# Patient Record
Sex: Female | Born: 1945 | Race: White | Hispanic: No | Smoking: Current some day smoker
Health system: Southern US, Community
[De-identification: ages and names within clinical notes are randomized; demographics above are authoritative.]

## PROBLEM LIST (undated history)

## (undated) DIAGNOSIS — E785 Hyperlipidemia, unspecified: Secondary | ICD-10-CM

## (undated) DIAGNOSIS — R918 Other nonspecific abnormal finding of lung field: Secondary | ICD-10-CM

## (undated) DIAGNOSIS — K589 Irritable bowel syndrome without diarrhea: Secondary | ICD-10-CM

## (undated) DIAGNOSIS — M810 Age-related osteoporosis without current pathological fracture: Secondary | ICD-10-CM

## (undated) DIAGNOSIS — Z8781 Personal history of (healed) traumatic fracture: Secondary | ICD-10-CM

## (undated) DIAGNOSIS — M0579 Rheumatoid arthritis with rheumatoid factor of multiple sites without organ or systems involvement: Secondary | ICD-10-CM

## (undated) DIAGNOSIS — I10 Essential (primary) hypertension: Secondary | ICD-10-CM

## (undated) DIAGNOSIS — J449 Chronic obstructive pulmonary disease, unspecified: Secondary | ICD-10-CM

## (undated) DIAGNOSIS — D751 Secondary polycythemia: Secondary | ICD-10-CM

## (undated) DIAGNOSIS — E119 Type 2 diabetes mellitus without complications: Secondary | ICD-10-CM

## (undated) DIAGNOSIS — I700 Atherosclerosis of aorta: Secondary | ICD-10-CM

## (undated) DIAGNOSIS — I499 Cardiac arrhythmia, unspecified: Secondary | ICD-10-CM

## (undated) DIAGNOSIS — C3490 Malignant neoplasm of unspecified part of unspecified bronchus or lung: Secondary | ICD-10-CM

## (undated) HISTORY — DX: Irritable bowel syndrome, unspecified: K58.9

## (undated) HISTORY — PX: OTHER SURGICAL HISTORY: SHX169

## (undated) HISTORY — DX: Secondary polycythemia: D75.1

## (undated) HISTORY — DX: Personal history of (healed) traumatic fracture: Z87.81

## (undated) HISTORY — DX: Cardiac arrhythmia, unspecified: I49.9

## (undated) HISTORY — PX: COLONOSCOPY: SHX174

## (undated) HISTORY — DX: Age-related osteoporosis without current pathological fracture: M81.0

## (undated) HISTORY — DX: Type 2 diabetes mellitus without complications: E11.9

## (undated) HISTORY — DX: Atherosclerosis of aorta: I70.0

## (undated) HISTORY — DX: Hyperlipidemia, unspecified: E78.5

## (undated) HISTORY — DX: Malignant neoplasm of unspecified part of unspecified bronchus or lung: C34.90

## (undated) HISTORY — DX: Chronic obstructive pulmonary disease, unspecified: J44.9

## (undated) HISTORY — DX: Essential (primary) hypertension: I10

## (undated) HISTORY — DX: Other nonspecific abnormal finding of lung field: R91.8

## (undated) HISTORY — DX: Rheumatoid arthritis with rheumatoid factor of multiple sites without organ or systems involvement: M05.79

---

## 1968-08-16 HISTORY — PX: BREAST BIOPSY: SHX20

## 2004-12-25 ENCOUNTER — Ambulatory Visit: Payer: Self-pay

## 2005-01-12 ENCOUNTER — Ambulatory Visit: Payer: Self-pay | Admitting: Internal Medicine

## 2005-04-09 ENCOUNTER — Ambulatory Visit: Payer: Self-pay | Admitting: Internal Medicine

## 2005-05-27 ENCOUNTER — Ambulatory Visit: Payer: Self-pay

## 2005-05-28 ENCOUNTER — Ambulatory Visit: Payer: Self-pay

## 2005-12-12 ENCOUNTER — Other Ambulatory Visit: Payer: Self-pay

## 2005-12-12 ENCOUNTER — Emergency Department: Payer: Self-pay | Admitting: Emergency Medicine

## 2006-01-14 ENCOUNTER — Ambulatory Visit: Payer: Self-pay | Admitting: Internal Medicine

## 2007-01-17 ENCOUNTER — Ambulatory Visit: Payer: Self-pay | Admitting: Internal Medicine

## 2007-03-10 ENCOUNTER — Other Ambulatory Visit: Payer: Self-pay

## 2007-03-10 ENCOUNTER — Ambulatory Visit: Payer: Self-pay | Admitting: Unknown Physician Specialty

## 2007-03-17 ENCOUNTER — Ambulatory Visit: Payer: Self-pay | Admitting: Gastroenterology

## 2007-03-21 ENCOUNTER — Ambulatory Visit: Payer: Self-pay | Admitting: Unknown Physician Specialty

## 2008-02-06 ENCOUNTER — Ambulatory Visit: Payer: Self-pay | Admitting: Internal Medicine

## 2009-03-27 ENCOUNTER — Ambulatory Visit: Payer: Self-pay | Admitting: Internal Medicine

## 2010-01-02 ENCOUNTER — Emergency Department: Payer: Self-pay | Admitting: Emergency Medicine

## 2010-04-28 ENCOUNTER — Ambulatory Visit: Payer: Self-pay | Admitting: Internal Medicine

## 2010-05-08 ENCOUNTER — Ambulatory Visit: Payer: Self-pay | Admitting: Gastroenterology

## 2010-05-13 LAB — PATHOLOGY REPORT

## 2011-05-12 ENCOUNTER — Ambulatory Visit: Payer: Self-pay | Admitting: Internal Medicine

## 2012-05-12 ENCOUNTER — Ambulatory Visit: Payer: Self-pay | Admitting: Internal Medicine

## 2012-10-06 ENCOUNTER — Ambulatory Visit: Payer: Self-pay | Admitting: Internal Medicine

## 2012-10-19 ENCOUNTER — Ambulatory Visit: Payer: Self-pay | Admitting: Hematology and Oncology

## 2012-10-19 LAB — CBC CANCER CENTER
Basophil #: 0.1 x10 3/mm (ref 0.0–0.1)
Basophil %: 1.4 %
Eosinophil #: 0.1 x10 3/mm (ref 0.0–0.7)
HCT: 50.7 % — ABNORMAL HIGH (ref 35.0–47.0)
HGB: 17.6 g/dL — ABNORMAL HIGH (ref 12.0–16.0)
MCH: 39.4 pg — ABNORMAL HIGH (ref 26.0–34.0)
MCHC: 34.7 g/dL (ref 32.0–36.0)
Monocyte #: 0.6 x10 3/mm (ref 0.2–0.9)
Neutrophil #: 4.4 x10 3/mm (ref 1.4–6.5)
Neutrophil %: 70.7 %
RBC: 4.47 10*6/uL (ref 3.80–5.20)
RDW: 14.9 % — ABNORMAL HIGH (ref 11.5–14.5)
WBC: 6.3 x10 3/mm (ref 3.6–11.0)

## 2012-10-27 LAB — CREATININE, SERUM
Creatinine: 0.81 mg/dL (ref 0.60–1.30)
EGFR (African American): >60
EGFR (Non-African Amer.): >60

## 2012-11-02 LAB — CBC CANCER CENTER
Eosinophil %: 0.6 %
HGB: 15.5 g/dL (ref 12.0–16.0)
Lymphocyte #: 1.1 x10 3/mm (ref 1.0–3.6)
MCH: 39.7 pg — ABNORMAL HIGH (ref 26.0–34.0)
MCHC: 34.6 g/dL (ref 32.0–36.0)
MCV: 115 fL — ABNORMAL HIGH (ref 80–100)
Monocyte #: 0.6 x10 3/mm (ref 0.2–0.9)
Neutrophil #: 4.4 x10 3/mm (ref 1.4–6.5)
Neutrophil %: 70.9 %
RDW: 15.2 % — ABNORMAL HIGH (ref 11.5–14.5)
WBC: 6.2 x10 3/mm (ref 3.6–11.0)

## 2012-11-14 ENCOUNTER — Ambulatory Visit: Payer: Self-pay | Admitting: Hematology and Oncology

## 2012-12-08 LAB — CBC CANCER CENTER
Eosinophil %: 1.6 %
HCT: 49 % — ABNORMAL HIGH (ref 35.0–47.0)
Lymphocyte #: 1.5 x10 3/mm (ref 1.0–3.6)
MCH: 40.1 pg — ABNORMAL HIGH (ref 26.0–34.0)
MCHC: 35 g/dL (ref 32.0–36.0)
Monocyte #: 0.8 x10 3/mm (ref 0.2–0.9)
Monocyte %: 9 %
Neutrophil #: 6 x10 3/mm (ref 1.4–6.5)
Neutrophil %: 69.9 %
Platelet: 166 x10 3/mm (ref 150–440)
RDW: 14.7 % — ABNORMAL HIGH (ref 11.5–14.5)
WBC: 8.5 x10 3/mm (ref 3.6–11.0)

## 2012-12-14 ENCOUNTER — Ambulatory Visit: Payer: Self-pay | Admitting: Hematology and Oncology

## 2013-01-22 ENCOUNTER — Ambulatory Visit: Payer: Self-pay | Admitting: Internal Medicine

## 2013-04-24 ENCOUNTER — Ambulatory Visit: Payer: Self-pay | Admitting: Internal Medicine

## 2013-05-14 ENCOUNTER — Ambulatory Visit: Payer: Self-pay | Admitting: Internal Medicine

## 2013-05-28 ENCOUNTER — Ambulatory Visit: Payer: Self-pay | Admitting: Internal Medicine

## 2013-10-24 ENCOUNTER — Ambulatory Visit: Payer: Self-pay | Admitting: Internal Medicine

## 2013-11-09 ENCOUNTER — Ambulatory Visit: Payer: Self-pay | Admitting: Cardiothoracic Surgery

## 2013-11-14 ENCOUNTER — Ambulatory Visit: Payer: Self-pay | Admitting: Cardiothoracic Surgery

## 2013-11-26 LAB — CBC CANCER CENTER
BASOS ABS: 0.1 x10 3/mm (ref 0.0–0.1)
BASOS PCT: 1.1 %
EOS PCT: 2.2 %
Eosinophil #: 0.2 x10 3/mm (ref 0.0–0.7)
HCT: 48.8 % — ABNORMAL HIGH (ref 35.0–47.0)
HGB: 16.6 g/dL — AB (ref 12.0–16.0)
Lymphocyte #: 1.7 x10 3/mm (ref 1.0–3.6)
Lymphocyte %: 20.5 %
MCH: 37.1 pg — AB (ref 26.0–34.0)
MCHC: 34 g/dL (ref 32.0–36.0)
MCV: 109 fL — ABNORMAL HIGH (ref 80–100)
MONO ABS: 0.5 x10 3/mm (ref 0.2–0.9)
Monocyte %: 6.5 %
NEUTROS PCT: 69.7 %
Neutrophil #: 5.8 x10 3/mm (ref 1.4–6.5)
Platelet: 156 x10 3/mm (ref 150–440)
RBC: 4.47 10*6/uL (ref 3.80–5.20)
RDW: 14 % (ref 11.5–14.5)
WBC: 8.3 x10 3/mm (ref 3.6–11.0)

## 2013-12-03 LAB — CANCER CENTER HEMATOCRIT: HCT: 44.9 % (ref 35.0–47.0)

## 2013-12-10 LAB — CANCER CENTER HEMATOCRIT: HCT: 43.3 % (ref 35.0–47.0)

## 2013-12-14 ENCOUNTER — Ambulatory Visit: Payer: Self-pay | Admitting: Cardiothoracic Surgery

## 2013-12-17 LAB — CANCER CENTER HEMATOCRIT: HCT: 39.7 % (ref 35.0–47.0)

## 2013-12-24 LAB — CANCER CENTER HEMATOCRIT: HCT: 42.7 % (ref 35.0–47.0)

## 2014-01-08 LAB — CANCER CENTER HEMATOCRIT: HCT: 45.2 % (ref 35.0–47.0)

## 2014-01-14 ENCOUNTER — Ambulatory Visit: Payer: Self-pay | Admitting: Cardiothoracic Surgery

## 2014-01-14 ENCOUNTER — Ambulatory Visit: Payer: Self-pay | Admitting: Internal Medicine

## 2014-01-21 LAB — CBC CANCER CENTER
BASOS PCT: 1.2 %
Basophil #: 0.1 x10 3/mm (ref 0.0–0.1)
Eosinophil #: 0.2 x10 3/mm (ref 0.0–0.7)
Eosinophil %: 2.6 %
HCT: 45.8 % (ref 35.0–47.0)
HGB: 15.7 g/dL (ref 12.0–16.0)
LYMPHS PCT: 17.1 %
Lymphocyte #: 1.3 x10 3/mm (ref 1.0–3.6)
MCH: 38 pg — ABNORMAL HIGH (ref 26.0–34.0)
MCHC: 34.3 g/dL (ref 32.0–36.0)
MCV: 111 fL — AB (ref 80–100)
Monocyte #: 0.5 x10 3/mm (ref 0.2–0.9)
Monocyte %: 7 %
Neutrophil #: 5.4 x10 3/mm (ref 1.4–6.5)
Neutrophil %: 72.1 %
Platelet: 167 x10 3/mm (ref 150–440)
RBC: 4.14 10*6/uL (ref 3.80–5.20)
RDW: 13.9 % (ref 11.5–14.5)
WBC: 7.4 x10 3/mm (ref 3.6–11.0)

## 2014-01-21 LAB — HEPATIC FUNCTION PANEL A (ARMC)
AST: 17 U/L (ref 15–37)
Albumin: 3.8 g/dL (ref 3.4–5.0)
Alkaline Phosphatase: 82 U/L
Bilirubin, Direct: 0.1 mg/dL (ref 0.00–0.20)
Bilirubin,Total: 0.4 mg/dL (ref 0.2–1.0)
SGPT (ALT): 18 U/L (ref 12–78)
TOTAL PROTEIN: 8.4 g/dL — AB (ref 6.4–8.2)

## 2014-01-21 LAB — CREATININE, SERUM
CREATININE: 0.77 mg/dL (ref 0.60–1.30)
EGFR (African American): >60
EGFR (Non-African Amer.): >60

## 2014-02-11 LAB — FOLATE: Folic Acid: 6.3 ng/mL (ref 3.1–100.0)

## 2014-02-11 LAB — CANCER CENTER HEMATOCRIT: HCT: 45.2 % (ref 35.0–47.0)

## 2014-02-13 ENCOUNTER — Ambulatory Visit: Payer: Self-pay | Admitting: Cardiothoracic Surgery

## 2014-02-13 ENCOUNTER — Ambulatory Visit: Payer: Self-pay | Admitting: Internal Medicine

## 2014-03-04 LAB — CANCER CENTER HEMATOCRIT: HCT: 48 % — ABNORMAL HIGH (ref 35.0–47.0)

## 2014-03-11 LAB — CANCER CENTER HEMATOCRIT: HCT: 44.5 % (ref 35.0–47.0)

## 2014-03-16 ENCOUNTER — Ambulatory Visit: Payer: Self-pay | Admitting: Cardiothoracic Surgery

## 2014-03-18 LAB — CANCER CENTER HEMATOCRIT: HCT: 41.1 % (ref 35.0–47.0)

## 2014-03-25 LAB — CANCER CENTER HEMATOCRIT: HCT: 39.5 % (ref 35.0–47.0)

## 2014-04-09 DIAGNOSIS — M0579 Rheumatoid arthritis with rheumatoid factor of multiple sites without organ or systems involvement: Secondary | ICD-10-CM | POA: Insufficient documentation

## 2014-04-09 DIAGNOSIS — M810 Age-related osteoporosis without current pathological fracture: Secondary | ICD-10-CM | POA: Insufficient documentation

## 2014-04-15 LAB — CANCER CENTER HEMATOCRIT: HCT: 42.3 % (ref 35.0–47.0)

## 2014-04-16 ENCOUNTER — Ambulatory Visit: Payer: Self-pay | Admitting: Cardiothoracic Surgery

## 2014-05-06 LAB — CANCER CENTER HEMATOCRIT: HCT: 44.9 % (ref 35.0–47.0)

## 2014-05-21 ENCOUNTER — Ambulatory Visit: Payer: Self-pay | Admitting: Internal Medicine

## 2014-05-27 ENCOUNTER — Ambulatory Visit: Payer: Self-pay | Admitting: Cardiothoracic Surgery

## 2014-05-27 LAB — CANCER CENTER HEMATOCRIT: HCT: 42.8 % (ref 35.0–47.0)

## 2014-06-16 ENCOUNTER — Ambulatory Visit: Payer: Self-pay | Admitting: Cardiothoracic Surgery

## 2014-06-19 LAB — CANCER CENTER HEMATOCRIT: HCT: 45.3 % (ref 35.0–47.0)

## 2014-07-08 LAB — CBC CANCER CENTER
BASOS PCT: 1.4 %
Basophil #: 0.1 x10 3/mm (ref 0.0–0.1)
EOS ABS: 0.2 x10 3/mm (ref 0.0–0.7)
Eosinophil %: 2.6 %
HCT: 44.8 % (ref 35.0–47.0)
HGB: 14.7 g/dL (ref 12.0–16.0)
Lymphocyte #: 1.7 x10 3/mm (ref 1.0–3.6)
Lymphocyte %: 23.8 %
MCH: 34.6 pg — ABNORMAL HIGH (ref 26.0–34.0)
MCHC: 32.9 g/dL (ref 32.0–36.0)
MCV: 105 fL — AB (ref 80–100)
MONO ABS: 0.6 x10 3/mm (ref 0.2–0.9)
MONOS PCT: 8.4 %
Neutrophil #: 4.4 x10 3/mm (ref 1.4–6.5)
Neutrophil %: 63.8 %
Platelet: 182 x10 3/mm (ref 150–440)
RBC: 4.26 10*6/uL (ref 3.80–5.20)
RDW: 15 % — ABNORMAL HIGH (ref 11.5–14.5)
WBC: 7 x10 3/mm (ref 3.6–11.0)

## 2014-07-16 ENCOUNTER — Ambulatory Visit: Payer: Self-pay | Admitting: Cardiothoracic Surgery

## 2014-08-05 LAB — CANCER CENTER HEMATOCRIT: HCT: 43.4 % (ref 35.0–47.0)

## 2014-08-16 ENCOUNTER — Ambulatory Visit: Payer: Self-pay | Admitting: Cardiothoracic Surgery

## 2014-09-02 LAB — CANCER CENTER HEMATOCRIT: HCT: 41.9 % (ref 35.0–47.0)

## 2014-09-16 ENCOUNTER — Ambulatory Visit: Payer: Self-pay | Admitting: Cardiothoracic Surgery

## 2014-10-15 ENCOUNTER — Ambulatory Visit
Admit: 2014-10-15 | Disposition: A | Discharge status: Home / Self Care | Payer: Self-pay | Attending: Internal Medicine | Admitting: Internal Medicine

## 2014-11-15 ENCOUNTER — Ambulatory Visit
Admit: 2014-11-15 | Disposition: A | Discharge status: Home / Self Care | Payer: Self-pay | Attending: Cardiothoracic Surgery | Admitting: Cardiothoracic Surgery

## 2014-11-25 LAB — CANCER CENTER HEMATOCRIT: HCT: 42.4 % (ref 35.0–47.0)

## 2014-12-07 NOTE — Consult Note (Signed)
Reason for Visit: This 69 year old Female patient presents to the clinic for initial evaluation of  possible lung cancer .   Referred by Dr. Faith Rogue.  Diagnosis:  Chief Complaint/Diagnosis   69 year-old female with left upper lobe nodular density being followed forsuspicion of carcinoma.  Imaging Report serial CT scans reviewed   Referral Report clinical notes reviewed   Planned Treatment Regimen observation at this time   HPI   patient is a 69 year old female with a history of polycythemia vera who initially presented with a CT scan in 2014 showing a 1 cm lesion in the left upper lobe. This has been followed with serial CT scan showing slight progression of disease. PET CT scan was ordered although Medicare would not pay for it and it was not performed. She's been followed by Dr. Faith Rogue for concern this may be a early pulmonary carcinoma. Her FEV1 is 39% of predicted and her DLCO is low at 37% predicted. Surgical exploration isn't not indicated and has been turned down by 6 surgical oncology. She is on multiple inhalers continues to have significant shortness of breath. No hemoptysis. She is now referred to reach oncology for consideration of opinion regarding treatment.patient has a significant smoking history and continues to smoke.  Past Hx:    Bursitis:    Anxiety:    Hyperlipidemia:    COPD:    IBS:    Polycythemia:    Skin Cancer:    Vitamin D Deficiency:    Osteoporosis:    vasomotor rhinitis:    Diabetes Mellitus, Type II (NIDD):    Rheumatoid Arthritis:    Cholesterol:    HTN:    breast biopsy:   Past, Family and Social History:  Past Medical History positive   Cardiovascular hyperlipidemia; hypertension   Respiratory COPD   Gastrointestinal irritable bowel syndrome   Endocrine diabetes mellitus   Neurological/Psychiatric anxiety   Past Medical History Comments polycythemia vera, rheumatoid arthritis, vasomotor rhinitis, osteoporosis, vitamin D  deficiency, bursitis   Family History positive   Family History Comments 3 brothers with lung cancer, father with COPD and mother with adult-onset diabetes   Social History positive   Social History Comments greater than 50-pack-year smoking history continues to smoke. social EtOH use history   Additional Past Medical and Surgical History accompanied by her son today   Allergies:   Actonel: Chest Tightness  Celexa: Chest Tightness  Citalopram: Chest Tightness  Augmentin: Resp. Distress  PCN: SOB  Cipro: Itching  Maxzide: Unknown  NSAIDS: Unknown  Nicoderm: Unknown  Zyban: Unknown  Wellbutrin: Unknown  Oxycodone: Alt Ment Status  Dicyclomine: Other  Ace Inhibitors: Unknown  Home Meds:  Home Medications: Medication Instructions Status  nicotine 7 mg/24 hr transdermal film, extended release 1 patch transdermal once a day x 2 weeks, starting on 01/21/14. Active  nicotine 14 mg/24 hr transdermal film, extended release 1 patch transdermal once a day x 2 weeks, starting on 01/07/14.   Active  nicotine 21 mg/24 hr transdermal film, extended release 1 patch transdermal once a day x 6 weeks Active  Advair Diskus 250 mcg-50 mcg inhalation powder 1 puff(s) inhaled 2 times a day Active  citalopram 20 mg oral tablet 1 tab(s) orally once a day Active  Flonase 50 mcg/inh nasal spray 2 spray(s) nasal once a day Active  alendronate 70 mg oral tablet 1 tab(s) orally once a week Active  albuterol CFC free 90 mcg/inh inhalation aerosol 2 puff(s) inhaled 4 times a day Active  cholecalciferol 1000 intl units oral capsule 1 cap(s) orally once a day Active  Klonopin 0.5 mg oral tablet 1 tab(s) orally 2 times a day, As Needed- for Anxiety, Nervousness  Active  omeprazole 20 mg oral delayed release capsule 1 cap(s) orally once a day Active  aspirin 81 mg oral tablet 1 tab(s) orally once a day:  PER PT SHE HAS STOPPED TAKING ASPIRIN PER THE MD'S NURSE AS OF 11/22/13 Active  atenolol 100 mg oral tablet 1  tab(s) orally once a day Active  amLODIPine 2.5 mg oral tablet 1 tab(s) orally once a day Active  mirtazapine 15 mg oral tablet 1 tab(s) orally once a day (at bedtime) Active  Vitamin B12 500 mcg oral tablet 1 tab(s) orally once a day Active   Review of Systems:  General negative   Performance Status (ECOG) 0   Skin negative   Breast negative   Ophthalmologic negative   ENMT negative   Respiratory and Thorax see HPI   Cardiovascular negative   Gastrointestinal negative   Genitourinary negative   Musculoskeletal negative   Neurological negative   Psychiatric negative   Hematology/Lymphatics negative   Endocrine negative   Allergic/Immunologic negative   Review of Systems   review of systems obtained from nurses notes  Nursing Notes:  Nursing Vital Signs and Chemo Nursing Nursing Notes: *CC Vital Signs Flowsheet:   13-Apr-15 09:09  Temp Temperature 95.9  Respirations Respirations 22  SBP SBP 158  DBP DBP 84  Pain Scale (0-10)  0  Current Weight (kg) (kg) 62.7  Height (cm) centimeters 163  BSA (m2) 1.6    11:44  Vital Signs Type Vital Signs Type Post-Procedure  Pulse Pulse 79  Respirations Respirations 20  SBP SBP 145  DBP DBP 92   Physical Exam:  General/Skin/HEENT:  General normal   Skin normal   Eyes normal   ENMT normal   Head and Neck normal   Additional PE well-developed female in NAD. Lungs are clear to A&P cardiac examination shows regular rate and rhythm. Abdomen is benign. No cervical or supraclavicular adenopathy is detected.   Breasts/Resp/CV/GI/GU:  Respiratory and Thorax normal   Cardiovascular normal   Gastrointestinal normal   Genitourinary normal   MS/Neuro/Psych/Lymph:  Musculoskeletal normal   Neurological normal   Lymphatics normal   Other Results:  Radiology Results: LabUnknown:    09-Sep-14 15:19, CT Chest Without Contrast  PACS Image     11-Mar-15 15:17, CT Chest Without Contrast  PACS Image   CT:     09-Sep-14 15:19, CT Chest Without Contrast  CT Chest Without Contrast   REASON FOR EXAM:    3 Follow Up Pulmonary Nodules  COMMENTS:       PROCEDURE: KCT - KCT CHEST WITHOUT CONTRAST  - Apr 24 2013  3:19PM     RESULT: History: Pulmonary nodules.    Comparison Study: CT chest 01/22/2013 and 10/27/2012. CT abdomen 05/28/2005.    Findings: Standard nonenhanced CT obtained. Small low density lesion is   noted in the  right thyroid gland. This contains calcification. This   could represent a thyroid malignancy and consideration for biopsy is   suggested. This is stable in appearance but nevertheless consideration   for biopsy should be considered. Multiple small nonspecific mediastinal   lymph nodes are again noted. Coronary artery disease. Adrenals normal.     Rounded mass noted adjacent to pancreas is unchanged from prior CT of   05/28/2005 and is consistent with a splenule.  Esophageal region is   normal. No acute bony abnormality.      Tiny triangular 1 cm density noted in the left upper lobe is unchanged in   appearance and although this may represent scar followup CTs for 2 year   period suggested to demonstrate stability. Tiny stable pulmonary   parenchymal including subpleural pinpoint densities are noted   bilaterally. No significant changes noted from prior exam . These   findings most likely related granulomas disease and/or scarring.   Atelectatic changes are again noted in the lung bases. Tiny nodule in the   right middle lobe is calcified again fsuggesting that the tiny pulmonary   nodules are granulomas.  IMPRESSION:   1. Low-density lesion in the right lobe of thyroid containing   calcification is stable however tiny thyroid malignancy cannot be   excluded. Consideration for biopsy the chest.  2. Stable triangular density left upper lobe( image 26). This is   unchanged in size and shape. This it is most likely secondary to   scarring. This will however need to  be  followed at 6 month intervals for   a total of 2 years.  3. Tiny pinpoint bilateral ulnar nodules most likely granulomas, no   change. These can be followed.        Verified By: Osa Craver, M.D., MD    11-Mar-15 15:17, CT Chest Without Contrast  CT Chest Without Contrast   REASON FOR EXAM:    left lung nodule  70moFU  COMMENTS:       PROCEDURE: KCT - KCT CHEST WITHOUT CONTRAST  - Oct 24 2013  3:17PM     CLINICAL DATA  Left lung nodule.    EXAM  CT CHEST WITHOUT CONTRAST    TECHNIQUE  Multidetector CT imaging of the chest was performed following the  standard protocol without IV contrast.  COMPARISON  04/24/2013.  01/22/2013.    FINDINGS  11 mm right inferior thyroid nodule is stable back to a study from  10/27/2012.    There is no axillary lymphadenopathy. There isno mediastinal or  hilar lymphadenopathy. The heart size is normal. Coronary artery  calcification is noted. No pericardial or pleural effusion.    Lung windows show underlying emphysema with an upper lobe  predominance. The linear scarring in the right middle lobe is  stable. The previously described 4 mm nodule in the medial right  upper lobe is not evident on today's study.  Previously described 3 mm nodule the right middle lobe is seen on  image 37 today and is stable.    4 mm subpleural nodule seen on image 8 today is likely the 5 mm left  upper lobe nodule described on the previous study.    The dominant nodule identified in the medial left upper lobe on  multiple previous studies measures 9 x 6 mm today compared to 10 x 7  mm on the most recent comparison study. When reviewing coronal and  sagittal reformations, this lesion extends 2.5 cm along the lateral  surface of the transverse aorta before extending up into the  parenchyma of the left upper lobe.    Bone windows reveal no worrisome lytic or sclerotic osseous lesions.  IMPRESSION  The lesion in question involving the medial left  upper lobe measured  9 x 6 x 25 mm on today's study. The lesion has remained relatively  stable on axial imaging, but has extended inferiorly along the  transverse aorta (compare image 16  of series 4 today to image 30 of  series 3 on the 04/24/2013 exam and image 32 of series 3 on the  study from 10/27/2012). This appearance suggests there has been some  interval growth of this lesion along itsinferior margin, creating a  more elongated shape. As such, PET-CT is recommended to ensure that  this lesion is not hypermetabolic.    SIGNATURE    Electronically Signed    By: Misty Stanley M.D.    On: 10/24/2013 18:06     CLINICAL DATA  Left lung nodule.    EXAM  CT CHEST WITHOUT CONTRAST    TECHNIQUE  Multidetector CT imaging of the chest was performed following the  standard protocol without IV contrast.    COMPARISON  04/24/2013.  01/22/2013.  FINDINGS  11 mm right inferior thyroid nodule is stable back to a study from  10/27/2012.    There is no axillary lymphadenopathy. There is no mediastinal or  hilar lymphadenopathy. The heart size is normal. Coronary artery  calcification is noted. No pericardial or pleural effusion.    Lung windows show underlying emphysema with an upper lobe  predominance. The linear scarring in the right middle lobe is  stable. The previously described 4 mm nodule in the medial right  upper lobe is not evident on today's study.    Previously described 3 mm nodule the right middle lobe is seen on  image 37 today and is stable.  4 mm subpleural nodule seen on image 8 today is likely the 5 mm left  upper lobe nodule described on the previous study.    The dominant nodule identified in the medial left upperlobe on  multiple previous studies measures 9 x 6 mm today compared to 10 x 7  mm on the most recent comparison study. When reviewing coronal and  sagittal reformations, this lesion extends 2.5 cm along the lateral  surface of the transverse aorta  before extending up into the  parenchyma of the left upper lobe.    Bone windows reveal no worrisome lytic or sclerotic osseous lesions.    IMPRESSION  The lesion in question involving the medial left upper lobe measured  9 x 6 x 25 mm on today's study.The lesion has remained relatively  stable on axial imaging, but has extended inferiorly along the  transverse aorta (compare image 16 of series 4 today to image 30 of  series 3 on the 04/24/2013 exam and image 32 of series 3 on the  study from 10/27/2012). This appearance suggests there has been some  interval growth of this lesion along its inferior margin, creating a  more elongated shape. As such, PET-CT is recommended to ensure that  this lesion is not hypermetabolic.    SIGNATURE    Electronically Signed    By: Misty Stanley M.D.    On: 10/24/2013 18:06       Verified By: ERIC A. MANSELL, M.D.,   Relevent Results:   Relevant Scans and Labs serial CT scans are reviewed   Assessment and Plan: Impression:   questionable left upper lobe pulmonary nodule suspicious for malignancy in 69 year old female with significant comorbidities and decreased pulmonary functions Plan:   I have reviewed her case and scans withDr Ma Hillock. I believe at this time we can continue to observe the patient's sister seems to be very small incremental increase in size of the left upper lobe pulmonary nodule over time. Certainly they met this may be an early lung cancer although  I believe biopsy or resection at this time is not indicated. Patient will see medical oncology today and they will follow the patient with followup CT scans in about 6 months time. I believe she would be a candidate for SB RT should this prove to be a malignancy. All this was explained in detail to the patient and her son both seem to comprehend my treatment plan well. Followup will be with medical oncology. I would be happy to reevaluate the patient at any time should radiation  oncology opinion be indicated.  I would like to take this opportunity for allowing me to participate in the care of your patient..  CC Referral:  cc: Dr. Kerrin Mo   Electronic Signatures: Baruch Gouty, Roda Shutters (MD)  (Signed 13-Apr-15 12:28)  Authored: HPI, Diagnosis, Past Hx, PFSH, Allergies, Home Meds, ROS, Nursing Notes, Physical Exam, Other Results, Relevent Results, Encounter Assessment and Plan, CC Referring Physician   Last Updated: 13-Apr-15 12:28 by Armstead Peaks (MD)

## 2014-12-20 ENCOUNTER — Other Ambulatory Visit: Payer: Self-pay | Admitting: *Deleted

## 2014-12-20 DIAGNOSIS — D751 Secondary polycythemia: Secondary | ICD-10-CM

## 2014-12-23 ENCOUNTER — Inpatient Hospital Stay (HOSPITAL_BASED_OUTPATIENT_CLINIC_OR_DEPARTMENT_OTHER): Payer: Medicare Other | Admitting: Internal Medicine

## 2014-12-23 ENCOUNTER — Other Ambulatory Visit: Payer: Self-pay | Admitting: *Deleted

## 2014-12-23 ENCOUNTER — Inpatient Hospital Stay: Payer: Medicare Other | Attending: Internal Medicine

## 2014-12-23 ENCOUNTER — Inpatient Hospital Stay: Payer: Medicare Other

## 2014-12-23 VITALS — BP 164/92 | HR 88 | Temp 97.8°F | Ht 63.0 in | Wt 140.4 lb

## 2014-12-23 DIAGNOSIS — Z7982 Long term (current) use of aspirin: Secondary | ICD-10-CM | POA: Diagnosis not present

## 2014-12-23 DIAGNOSIS — R7989 Other specified abnormal findings of blood chemistry: Secondary | ICD-10-CM

## 2014-12-23 DIAGNOSIS — J449 Chronic obstructive pulmonary disease, unspecified: Secondary | ICD-10-CM | POA: Diagnosis not present

## 2014-12-23 DIAGNOSIS — D751 Secondary polycythemia: Secondary | ICD-10-CM | POA: Insufficient documentation

## 2014-12-23 DIAGNOSIS — I7090 Unspecified atherosclerosis: Secondary | ICD-10-CM | POA: Diagnosis not present

## 2014-12-23 DIAGNOSIS — I10 Essential (primary) hypertension: Secondary | ICD-10-CM | POA: Insufficient documentation

## 2014-12-23 DIAGNOSIS — E119 Type 2 diabetes mellitus without complications: Secondary | ICD-10-CM

## 2014-12-23 DIAGNOSIS — Z79899 Other long term (current) drug therapy: Secondary | ICD-10-CM | POA: Insufficient documentation

## 2014-12-23 DIAGNOSIS — F17210 Nicotine dependence, cigarettes, uncomplicated: Secondary | ICD-10-CM | POA: Insufficient documentation

## 2014-12-23 DIAGNOSIS — R918 Other nonspecific abnormal finding of lung field: Secondary | ICD-10-CM

## 2014-12-23 DIAGNOSIS — F449 Dissociative and conversion disorder, unspecified: Secondary | ICD-10-CM

## 2014-12-23 DIAGNOSIS — J439 Emphysema, unspecified: Secondary | ICD-10-CM | POA: Insufficient documentation

## 2014-12-23 DIAGNOSIS — K635 Polyp of colon: Secondary | ICD-10-CM

## 2014-12-23 LAB — CBC
HCT: 45.7 % (ref 35.0–47.0)
Hemoglobin: 15.4 g/dL (ref 12.0–16.0)
MCH: 34.6 pg — AB (ref 26.0–34.0)
MCHC: 33.7 g/dL (ref 32.0–36.0)
MCV: 102.7 fL — AB (ref 80.0–100.0)
Platelets: 159 10*3/uL (ref 150–440)
RBC: 4.45 MIL/uL (ref 3.80–5.20)
RDW: 15.3 % — ABNORMAL HIGH (ref 11.5–14.5)
WBC: 6.4 10*3/uL (ref 3.6–11.0)

## 2015-01-10 NOTE — Progress Notes (Signed)
[facility]  [phone] [phone]     ID: Madeline Schmitt OB: 1945-10-24  MR#: [id number]  OVF#:[id number]  Patient Care Team: Adin Hector, MD as PCP - General (Internal Medicine)  CHIEF COMPLAINT/DIAGNOSIS:  1. Known h/o Erythrocytosis likely secondary to history of chronic smoking and chronic obstructive airways disease. (Previous workup in showed WBC 6500, Hb of 17.7, Hct 50.8, B12 and folate normal, serum EPO 16.0).  2. Left upper lobe Lung nodule - evaluated by thoracic surgeon Dr.Oaks in April 2015.       HISTORY OF PRESENT ILLNESS:  Patient returns for continued hematology followup, she was last seen in Nov 2015. In between she had hematocrit monitored and it has been in the range of 41.9 - 43.9, but today is higher at 45.7. States that she is doing steady, trying to continue to cut down on number of cigarettes and is down to less than 1/3 pack a day. No hemoptysis, chest pain, fever, or chills. No new dyspnea, orthopnea, or PND. Chronic dyspnea on exertion is otherwise at baseline. Denies any history of thromboembolic phenomenon including DVT, pulmonary embolism, TIA, stroke, or heart attack. Appetite is good, denies unintentional weight loss.  REVIEW OF SYSTEMS:   ROS As in HPI above. In addition, no fever, chills or sweats. No new headaches or focal weakness.  No sore throat, cough, sputum, hemoptysis or chest pain. No dizziness or palpitation. No abdominal pain, constipation, diarrhea, dysuria or hematuria. No new skin rash or bleeding symptoms. No new paresthesias in extremities.   PAST MEDICAL HISTORY:         Hypertension  Diabetes  COPD  Colon polyps  Chronic smoking         Erythrocytosis  PAST SURGICAL HISTORY: unremarkable  FAMILY HISTORY: remarkable for diabetes   ADVANCED DIRECTIVES:   SOCIAL HISTORY: History  Substance Use Topics  . Smoking status: Not on file  . Smokeless tobacco: Not on file  . Alcohol Use:  Not on file  Chronic smoker, one pack per day for >40 years. Frequent alcohol intake, mostly beer. Denies recreational drug usage. Physically active.  Allergies  Allergen Reactions  . Augmentin [Amoxicillin-Pot Clavulanate] Shortness Of Breath  . Penicillins Shortness Of Breath  . Ace Inhibitors Other (See Comments)    Unknown   . Actonel [Risedronate Sodium] Other (See Comments)    Chest tightness  . Celexa [Citalopram Hydrobromide] Other (See Comments)    Chest tightness  . Dicyclomine Hcl Other (See Comments)    Flatulence  . Maxzide [Triamterene-Hctz] Other (See Comments)    unknown  . Nicoderm [Nicotine] Other (See Comments)    Unknown   . Nsaids Other (See Comments)    Unknown   . Oxycodone Other (See Comments)    Alt Mental Status  . Zyban [Bupropion] Other (See Comments)    Nervous    Current Outpatient Prescriptions  Medication Sig Dispense Refill  . alendronate (FOSAMAX) 70 MG tablet Take 70 mg by mouth once a week. Take with a full glass of water on an empty stomach.    Marland Kitchen amLODipine (NORVASC) 2.5 MG tablet Take 2.5 mg by mouth daily.    Marland Kitchen aspirin 81 MG tablet Take 81 mg by mouth daily.    Marland Kitchen azelastine (ASTELIN) 0.1 % nasal spray Place into both nostrils 2 (two) times daily. Use in each nostril as directed    . Cholecalciferol 10000 UNITS CAPS Take 1 capsule by mouth once.    Marland Kitchen  clonazePAM (KLONOPIN) 0.5 MG tablet Take 0.5 mg by mouth 2 (two) times daily as needed for anxiety.    . fluticasone (FLONASE) 50 MCG/ACT nasal spray Place into both nostrils daily.    . Fluticasone-Salmeterol (ADVAIR) 250-50 MCG/DOSE AEPB Inhale 1 puff into the lungs 2 (two) times daily.    . mirtazapine (REMERON) 15 MG tablet Take 15 mg by mouth at bedtime.    Marland Kitchen omeprazole (PRILOSEC) 20 MG capsule Take 20 mg by mouth daily.     No current facility-administered medications for this visit.    OBJECTIVE: Filed Vitals:   12/23/14 1512  BP: 164/92  Pulse: 88  Temp: 97.8 F (36.6 C)      Body mass index is 24.88 kg/(m^2).      GENERAL: Patient is alert and oriented and in no acute distress. There is no icterus. No pallor. HEENT: EOMs intact. No cervical lymphadenopathy. CVS: S1S2, regular LUNGS: Bilaterally clear to auscultation, no rhonchi. ABDOMEN: Soft, nontender. No hepatosplenomegaly clinically.  EXTREMITIES: No pedal edema. LYMPHATICS: No palpable adenopathy in axillary or inguinal areas.   LAB RESULTS:  Lab Results  Component Value Date   WBC 6.4 12/23/2014   NEUTROABS 4.4 07/08/2014   HGB 15.4 12/23/2014   HCT 45.7 12/23/2014   MCV 102.7* 12/23/2014   PLT 159 12/23/2014            STUDIES: 05/27/14 - CT chest without contrast. IMPRESSION:  1. Since 01/21/2014, similar size of a medial left upper lobe pulmonary lesion. Somewhat linear and not a typical morphology for primary bronchogenic carcinoma. This cannot however be excluded. PET should again be considered. If not performed, follow-up with chest CT at 3-6 months recommended.  2.  Atherosclerosis, including within the coronary arteries.  3. Moderate emphysema.  ASSESSMENT / PLAN:   1. Known h/o Erythrocytosis likely secondary to history of chronic smoking and chronic obstructive airways disease (Previous workup in showed WBC 6500, Hb of 17.7, Hct 50.8, B12 and folate normal, serum EPO 16.0)  -  Reviewed labs and d/w patient. Clinically doing steady, no history of thromboembolic phenomenon. Hematocrit is under fairly good control but today is higher than target range of 43 and is 45.7. She will, therefore, get phlebotomy 300 mL today. Plan is to continue monitoring hematocrit once every 8 weeks and pursue phlebotomy 300 mL if it is 43 or higher. Next time to follow up at 48 weeks with CBC/differential and make further plan of management.  2. Smoking Cessation - have again explained about extreme importance of quitting smoking completely both from point of view of erythrocytosis and also bronchitis and  other complications of smoking, patient does not want to try patches or medication and states that she will continue to try to cut down and quit completely on her own.  2. Left upper lobe Lung nodule - seen by thoracic surgeon Dr.Oaks in April 2015. CT chest on 05/27/14 reports since 01/21/2014, similar size of a medial left upper lobe pulmonary lesion, somewhat linear and not a typical morphology for primary bronchogenic carcinoma. Recommended CT scan for continued surveillance, patient however states she does not want PET scan or CT scan unless she has new respiratory symptoms.   3. Elevated MCV - advised to avoid alcohol intake. Prior B12 and folate unremarkable.     4. In between visits, the patient has been advised to call or come to the ER in case of fevers, bleeding, acute sickness or new symptoms. Patient is agreeable to this plan.  Leia Alf, MD   01/10/2015 10:22 AM

## 2015-02-18 ENCOUNTER — Inpatient Hospital Stay: Payer: Medicare Other

## 2015-02-18 ENCOUNTER — Inpatient Hospital Stay: Payer: Medicare Other | Attending: Internal Medicine

## 2015-02-18 VITALS — BP 145/85 | HR 80 | Temp 97.0°F | Resp 20

## 2015-02-18 DIAGNOSIS — D751 Secondary polycythemia: Secondary | ICD-10-CM | POA: Diagnosis present

## 2015-02-18 LAB — HEMATOCRIT: HEMATOCRIT: 46.9 % (ref 35.0–47.0)

## 2015-04-14 ENCOUNTER — Inpatient Hospital Stay: Payer: Medicare Other

## 2015-04-14 ENCOUNTER — Inpatient Hospital Stay: Payer: Medicare Other | Attending: Internal Medicine

## 2015-04-14 DIAGNOSIS — D751 Secondary polycythemia: Secondary | ICD-10-CM | POA: Insufficient documentation

## 2015-04-14 LAB — HEMATOCRIT: HCT: 46.1 % (ref 35.0–47.0)

## 2015-04-23 ENCOUNTER — Other Ambulatory Visit: Payer: Self-pay | Admitting: Internal Medicine

## 2015-04-23 DIAGNOSIS — Z1239 Encounter for other screening for malignant neoplasm of breast: Secondary | ICD-10-CM

## 2015-05-26 ENCOUNTER — Ambulatory Visit
Admission: RE | Admit: 2015-05-26 | Discharge: 2015-05-26 | Disposition: A | Discharge status: Home / Self Care | Payer: Medicare Other | Source: Ambulatory Visit | Attending: Internal Medicine | Admitting: Internal Medicine

## 2015-05-26 ENCOUNTER — Other Ambulatory Visit: Payer: Self-pay | Admitting: Internal Medicine

## 2015-05-26 DIAGNOSIS — Z1231 Encounter for screening mammogram for malignant neoplasm of breast: Secondary | ICD-10-CM | POA: Diagnosis present

## 2015-05-26 DIAGNOSIS — Z1239 Encounter for other screening for malignant neoplasm of breast: Secondary | ICD-10-CM

## 2015-06-09 ENCOUNTER — Inpatient Hospital Stay: Payer: Medicare Other

## 2015-06-09 ENCOUNTER — Inpatient Hospital Stay: Payer: Medicare Other | Attending: Internal Medicine

## 2015-06-09 VITALS — BP 114/75 | HR 80 | Temp 97.0°F | Resp 18

## 2015-06-09 DIAGNOSIS — D751 Secondary polycythemia: Secondary | ICD-10-CM | POA: Diagnosis not present

## 2015-06-09 LAB — HEMATOCRIT: HCT: 44.9 % (ref 35.0–47.0)

## 2015-08-04 ENCOUNTER — Inpatient Hospital Stay: Payer: Medicare Other | Attending: Internal Medicine

## 2015-08-04 ENCOUNTER — Other Ambulatory Visit: Payer: Self-pay | Admitting: Internal Medicine

## 2015-08-04 ENCOUNTER — Inpatient Hospital Stay: Payer: Medicare Other

## 2015-08-04 VITALS — BP 147/82 | HR 77 | Temp 97.3°F | Resp 18

## 2015-08-04 DIAGNOSIS — D751 Secondary polycythemia: Secondary | ICD-10-CM | POA: Diagnosis not present

## 2015-08-04 LAB — HEMATOCRIT: HEMATOCRIT: 45.2 % (ref 35.0–47.0)

## 2015-08-26 ENCOUNTER — Encounter: Admission: RE | Payer: Self-pay | Source: Ambulatory Visit

## 2015-08-26 ENCOUNTER — Ambulatory Visit: Admission: RE | Admit: 2015-08-26 | Payer: Medicare Other | Source: Ambulatory Visit | Admitting: Gastroenterology

## 2015-08-26 SURGERY — COLONOSCOPY WITH PROPOFOL
Anesthesia: General

## 2015-09-29 ENCOUNTER — Inpatient Hospital Stay: Payer: Medicare Other

## 2015-09-29 ENCOUNTER — Inpatient Hospital Stay: Payer: Medicare Other | Admitting: Internal Medicine

## 2015-09-29 ENCOUNTER — Other Ambulatory Visit: Payer: Self-pay | Admitting: *Deleted

## 2015-09-29 ENCOUNTER — Ambulatory Visit: Payer: No Typology Code available for payment source

## 2015-09-29 DIAGNOSIS — D751 Secondary polycythemia: Secondary | ICD-10-CM

## 2015-10-06 ENCOUNTER — Inpatient Hospital Stay: Payer: Medicare Other

## 2015-10-06 ENCOUNTER — Inpatient Hospital Stay: Payer: Medicare Other | Attending: Internal Medicine | Admitting: Internal Medicine

## 2015-10-06 VITALS — BP 163/96 | HR 89 | Temp 98.0°F | Resp 18 | Ht 63.0 in | Wt 147.0 lb

## 2015-10-06 DIAGNOSIS — F17210 Nicotine dependence, cigarettes, uncomplicated: Secondary | ICD-10-CM | POA: Diagnosis not present

## 2015-10-06 DIAGNOSIS — R918 Other nonspecific abnormal finding of lung field: Secondary | ICD-10-CM | POA: Diagnosis not present

## 2015-10-06 DIAGNOSIS — D751 Secondary polycythemia: Secondary | ICD-10-CM | POA: Diagnosis present

## 2015-10-06 LAB — CBC WITH DIFFERENTIAL/PLATELET
BASOS PCT: 0 %
Basophils Absolute: 0 10*3/uL (ref 0–0.1)
Eosinophils Absolute: 0.3 10*3/uL (ref 0–0.7)
Eosinophils Relative: 5 %
HEMATOCRIT: 45 % (ref 35.0–47.0)
HEMOGLOBIN: 15.4 g/dL (ref 12.0–16.0)
LYMPHS ABS: 1.8 10*3/uL (ref 1.0–3.6)
Lymphocytes Relative: 28 %
MCH: 34.7 pg — AB (ref 26.0–34.0)
MCHC: 34.2 g/dL (ref 32.0–36.0)
MCV: 101.3 fL — AB (ref 80.0–100.0)
MONO ABS: 0.6 10*3/uL (ref 0.2–0.9)
MONOS PCT: 9 %
NEUTROS ABS: 3.8 10*3/uL (ref 1.4–6.5)
NEUTROS PCT: 58 %
Platelets: 189 10*3/uL (ref 150–440)
RBC: 4.44 MIL/uL (ref 3.80–5.20)
RDW: 14.3 % (ref 11.5–14.5)
WBC: 6.6 10*3/uL (ref 3.6–11.0)

## 2015-10-06 LAB — FERRITIN: Ferritin: 22 ng/mL (ref 11–307)

## 2015-10-06 NOTE — Progress Notes (Signed)
[facility] OFFICE PROGRESS NOTE  Patient Care Team: Adin Hector, MD as PCP - General (Internal Medicine)   SUMMARY OF ONCOLOGIC HISTORY:  # ERYTHROCYTOSIS- likely sec to smoking [Jak-2/exon 12- NEG]  # Hx of smoking  INTERVAL HISTORY:  This is my first interaction with the patient since I joined the practice September 2016. I reviewed the patient's prior charts/pertinent labs in detail; findings are summarized above.   A very pleasant 70 year old female patient with above history of smoking and a history of erythrocytosis is here for follow-up. Patient had last phlebotomy- approximately 2 months ago when the hematocrit was just above 45.  Patient states that she feels a little dizzy for approximately a week or so after the phlebotomy. She denies any current symptoms of headaches or vision changes or double vision.   Denies any unusual weight loss or unusual cough or shortness of breath or chest pain.    REVIEW OF SYSTEMS:  A complete 10 point review of system is done which is negative except mentioned above/history of present illness.   PAST MEDICAL HISTORY : No past medical history on file.  PAST SURGICAL HISTORY :   Past Surgical History  Procedure Laterality Date  . Breast biopsy Bilateral     negative    FAMILY HISTORY :   Family History  Problem Relation Age of Onset  . Breast cancer Neg Hx     SOCIAL HISTORY:  History of smoking; denies alcohol abuse.  Social History  Substance Use Topics  . Smoking status: Not on file  . Smokeless tobacco: Not on file  . Alcohol Use: Not on file    ALLERGIES:  is allergic to augmentin; penicillins; ace inhibitors; actonel; celexa; dicyclomine hcl; maxzide; nicoderm; nsaids; oxycodone; and zyban.  MEDICATIONS:  Current Outpatient Prescriptions  Medication Sig Dispense Refill  . alendronate (FOSAMAX) 70 MG tablet Take 70 mg by mouth once a week. Take with a full glass of water on an empty stomach.    Marland Kitchen  amLODipine (NORVASC) 2.5 MG tablet Take 2.5 mg by mouth daily.    Marland Kitchen aspirin 81 MG tablet Take 81 mg by mouth daily.    Marland Kitchen azelastine (ASTELIN) 0.1 % nasal spray Place into both nostrils 2 (two) times daily. Use in each nostril as directed    . Cholecalciferol 10000 UNITS CAPS Take 1 capsule by mouth once.    . clonazePAM (KLONOPIN) 0.5 MG tablet Take 0.5 mg by mouth 2 (two) times daily as needed for anxiety.    . fluticasone (FLONASE) 50 MCG/ACT nasal spray Place into both nostrils daily.    . Fluticasone-Salmeterol (ADVAIR) 250-50 MCG/DOSE AEPB Inhale 1 puff into the lungs 2 (two) times daily.    . mirtazapine (REMERON) 15 MG tablet Take 15 mg by mouth at bedtime.    Marland Kitchen omeprazole (PRILOSEC) 20 MG capsule Take 20 mg by mouth daily.     No current facility-administered medications for this visit.    PHYSICAL EXAMINATION:   BP 163/96 mmHg  Pulse 89  Temp(Src) 98 F (36.7 C) (Tympanic)  Resp 18  Ht '5\' 3"'$  (1.6 m)  Wt 147 lb 0.8 oz (66.7 kg)  BMI 26.05 kg/m2  Filed Weights   10/06/15 1442  Weight: 147 lb 0.8 oz (66.7 kg)    GENERAL: Well-nourished well-developed; Alert, no distress and comfortable.  Alone. EYES: no pallor or icterus OROPHARYNX: no thrush or ulceration; dentures.  NECK: supple, no masses felt LYMPH:  no palpable lymphadenopathy  in the cervical, axillary or inguinal regions LUNGS: clear to auscultation and  No wheeze or crackles HEART/CVS: regular rate & rhythm and no murmurs; No lower extremity edema ABDOMEN:abdomen soft, non-tender and normal bowel sounds Musculoskeletal:no cyanosis of digits and no clubbing  PSYCH: alert & oriented x 3 with fluent speech NEURO: no focal motor/sensory deficits SKIN:  no rashes or significant lesions  LABORATORY DATA:  I have reviewed the data as listed    Component Value Date/Time   CREATININE 0.77 01/21/2014 1031   PROT 8.4* 01/21/2014 1031   ALBUMIN 3.8 01/21/2014 1031   AST 17 01/21/2014 1031   ALT 18 01/21/2014 1031    ALKPHOS 82 01/21/2014 1031   BILITOT 0.4 01/21/2014 1031   GFRNONAA >60 01/21/2014 1031   GFRAA >60 01/21/2014 1031    No results found for: SPEP, UPEP  Lab Results  Component Value Date   WBC 6.6 10/06/2015   NEUTROABS 3.8 10/06/2015   HGB 15.4 10/06/2015   HCT 45.0 10/06/2015   MCV 101.3* 10/06/2015   PLT 189 10/06/2015      Chemistry      Component Value Date/Time   CREATININE 0.77 01/21/2014 1031      Component Value Date/Time   ALKPHOS 82 01/21/2014 1031   AST 17 01/21/2014 1031   ALT 18 01/21/2014 1031   BILITOT 0.4 01/21/2014 1031       ASSESSMENT & PLAN:   # 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 Patient gets dizzy after phlebotomy;and also patient does not feel any worse prior tophlebotomy.  so recommend holding phlebotomy unless her hematocrit is greater than 50. Today hematocrit is 45.   # patient will  Get labs in 3 months/possible phlebotomy; follow-up with me in 6 months with CBC.  # history of lung nodules most recent CAT scan with PCP negative for any malignancy.  # discussed regarding smoking cessation- patient stated that she has discussed with her PCP multiple times in the past.  # 15 minutes face-to-face with the patient discussing the above plan of care; more than 50% of time spent on natural history; counseling and coordination.      Cammie Sickle, MD 10/06/2015 3:16 PM

## 2015-11-24 ENCOUNTER — Ambulatory Visit: Payer: No Typology Code available for payment source

## 2015-11-24 ENCOUNTER — Other Ambulatory Visit: Payer: No Typology Code available for payment source

## 2015-11-24 ENCOUNTER — Ambulatory Visit: Payer: No Typology Code available for payment source | Admitting: Internal Medicine

## 2015-12-30 DIAGNOSIS — F4320 Adjustment disorder, unspecified: Secondary | ICD-10-CM | POA: Insufficient documentation

## 2015-12-30 DIAGNOSIS — F4321 Adjustment disorder with depressed mood: Secondary | ICD-10-CM | POA: Insufficient documentation

## 2016-01-05 ENCOUNTER — Inpatient Hospital Stay: Payer: No Typology Code available for payment source

## 2016-04-05 ENCOUNTER — Inpatient Hospital Stay: Payer: Medicare Other

## 2016-04-05 ENCOUNTER — Encounter: Payer: Self-pay | Admitting: Internal Medicine

## 2016-04-05 ENCOUNTER — Inpatient Hospital Stay: Payer: Medicare Other | Attending: Internal Medicine | Admitting: Internal Medicine

## 2016-04-05 VITALS — BP 159/88 | HR 86 | Temp 96.2°F | Resp 18 | Ht 63.0 in | Wt 142.6 lb

## 2016-04-05 DIAGNOSIS — I10 Essential (primary) hypertension: Secondary | ICD-10-CM | POA: Diagnosis not present

## 2016-04-05 DIAGNOSIS — Z79899 Other long term (current) drug therapy: Secondary | ICD-10-CM | POA: Diagnosis not present

## 2016-04-05 DIAGNOSIS — D751 Secondary polycythemia: Secondary | ICD-10-CM

## 2016-04-05 DIAGNOSIS — J449 Chronic obstructive pulmonary disease, unspecified: Secondary | ICD-10-CM | POA: Diagnosis not present

## 2016-04-05 DIAGNOSIS — F17210 Nicotine dependence, cigarettes, uncomplicated: Secondary | ICD-10-CM | POA: Diagnosis not present

## 2016-04-05 DIAGNOSIS — B029 Zoster without complications: Secondary | ICD-10-CM | POA: Insufficient documentation

## 2016-04-05 DIAGNOSIS — Z7982 Long term (current) use of aspirin: Secondary | ICD-10-CM | POA: Insufficient documentation

## 2016-04-05 LAB — CBC WITH DIFFERENTIAL/PLATELET
BASOS ABS: 0.1 10*3/uL (ref 0–0.1)
BASOS PCT: 1 %
EOS ABS: 0.2 10*3/uL (ref 0–0.7)
Eosinophils Relative: 2 %
HEMATOCRIT: 45.8 % (ref 35.0–47.0)
HEMOGLOBIN: 16 g/dL (ref 12.0–16.0)
Lymphocytes Relative: 22 %
Lymphs Abs: 1.8 10*3/uL (ref 1.0–3.6)
MCH: 37.7 pg — ABNORMAL HIGH (ref 26.0–34.0)
MCHC: 35 g/dL (ref 32.0–36.0)
MCV: 107.8 fL — ABNORMAL HIGH (ref 80.0–100.0)
MONO ABS: 0.7 10*3/uL (ref 0.2–0.9)
MONOS PCT: 8 %
NEUTROS ABS: 5.5 10*3/uL (ref 1.4–6.5)
NEUTROS PCT: 67 %
Platelets: 152 10*3/uL (ref 150–440)
RBC: 4.25 MIL/uL (ref 3.80–5.20)
RDW: 14.4 % (ref 11.5–14.5)
WBC: 8.3 10*3/uL (ref 3.6–11.0)

## 2016-04-05 NOTE — Progress Notes (Signed)
[facility] OFF[facility] PROGRESS NOTE  Patient Care Team: Adin Hector, MD as PCP - General (Internal Medicine)   SUMMARY OF ONCOLOGIC HISTORY:  # ERYTHROCYTOSIS- likely sec to smoking [Jak-2/exon 12- NEG]  # Hx of smoking- does not want to quit.   INTERVAL HISTORY:  A very pleasant 70 year old female patient with above history of smoking and a history of erythrocytosis is here for follow-up. Patient's last phlebotomy was more than a year ago.  Patient recently lost her sister; very emotional. She unfortunately continues to smoke- in spite of multiple recommendations to quit smoking.   She denies any current symptoms of headaches or vision changes or double vision.  Denies any unusual weight loss or unusual cough or shortness of breath or chest pain.    REVIEW OF SYSTEMS:  A complete 10 point review of system is done which is negative except mentioned above/history of present illness.   PAST MEDICAL HISTORY : No past medical history on file.; HTN; COPD  PAST SURGICAL HISTORY :   Past Surgical History:  Procedure Laterality Date  . BREAST BIOPSY Bilateral    negative    FAMILY HISTORY :   Family History  Problem Relation Age of Onset  . Breast cancer Neg Hx     SOCIAL HISTORY:  History of smoking; denies alcohol abuse.  Social History  Substance Use Topics  . Smoking status: Current Some Day Smoker    Packs/day: 0.50    Years: 55.00  . Smokeless tobacco: Never Used  . Alcohol use 8.4 oz/week    14 Glasses of wine per week    ALLERGIES:  is allergic to augmentin [amoxicillin-pot clavulanate]; penicillins; ace inhibitors; actonel [risedronate sodium]; celexa [citalopram hydrobromide]; dicyclomine hcl; maxzide [triamterene-hctz]; nicoderm [nicotine]; nsaids; oxycodone; and zyban [bupropion].  MEDICATIONS:  Current Outpatient Prescriptions  Medication Sig Dispense Refill  . albuterol (PROVENTIL HFA;VENTOLIN HFA) 108 (90 Base) MCG/ACT inhaler Inhale into  the lungs.    Marland Kitchen alendronate (FOSAMAX) 70 MG tablet Take 70 mg by mouth once a week. Take with a full glass of water on an empty stomach.    Marland Kitchen amLODipine (NORVASC) 2.5 MG tablet Take 2.5 mg by mouth daily.    Marland Kitchen aspirin 81 MG tablet Take 81 mg by mouth daily.    Marland Kitchen azelastine (ASTELIN) 0.1 % nasal spray Place into both nostrils 2 (two) times daily. Use in each nostril as directed    . Cholecalciferol 10000 UNITS CAPS Take 1 capsule by mouth once.    . clonazePAM (KLONOPIN) 0.5 MG tablet Take 0.5 mg by mouth 2 (two) times daily as needed for anxiety.    . fluticasone (FLONASE) 50 MCG/ACT nasal spray Place into both nostrils daily.    . Fluticasone-Salmeterol (ADVAIR) 250-50 MCG/DOSE AEPB Inhale 1 puff into the lungs 2 (two) times daily.    Marland Kitchen omeprazole (PRILOSEC) 20 MG capsule Take 20 mg by mouth daily.    . QUEtiapine (SEROQUEL) 25 MG tablet Take 25 mg by mouth at bedtime.  0   No current facility-administered medications for this visit.     PHYSICAL EXAMINATION:   BP (!) 159/88 (BP Location: Left Arm, Patient Position: Sitting)   Pulse 86   Temp (!) 96.2 F (35.7 C) (Tympanic)   Resp 18   Ht '5\' 3"'$  (1.6 m)   Wt 142 lb 9.6 oz (64.7 kg)   BMI 25.26 kg/m   Filed Weights   04/05/16 1414  Weight: 142 lb 9.6 oz (64.7 kg)  GENERAL: Well-nourished well-developed; Alert, no distress and comfortable.  Alone. EYES: no pallor or icterus OROPHARYNX: no thrush or ulceration; dentures.  NECK: supple, no masses felt LYMPH:  no palpable lymphadenopathy in the cervical, axillary or inguinal regions LUNGS: clear to auscultation and  Scattered wheezing HEART/CVS: regular rate & rhythm and no murmurs; No lower extremity edema ABDOMEN:abdomen soft, non-tender and normal bowel sounds Musculoskeletal:no cyanosis of digits and no clubbing  PSYCH: alert & oriented x 3 with fluent speech NEURO: no focal motor/sensory deficits SKIN:  no rashes or significant lesions  LABORATORY DATA:  I have  reviewed the data as listed    Component Value Date/Time   CREATININE 0.77 01/21/2014 1031   PROT 8.4 (H) 01/21/2014 1031   ALBUMIN 3.8 01/21/2014 1031   AST 17 01/21/2014 1031   ALT 18 01/21/2014 1031   ALKPHOS 82 01/21/2014 1031   BILITOT 0.4 01/21/2014 1031   GFRNONAA >60 01/21/2014 1031   GFRAA >60 01/21/2014 1031    No results found for: SPEP, UPEP  Lab Results  Component Value Date   WBC 8.3 04/05/2016   NEUTROABS 5.5 04/05/2016   HGB 16.0 04/05/2016   HCT 45.8 04/05/2016   MCV 107.8 (H) 04/05/2016   PLT 152 04/05/2016      Chemistry      Component Value Date/Time   CREATININE 0.77 01/21/2014 1031      Component Value Date/Time   ALKPHOS 82 01/21/2014 1031   AST 17 01/21/2014 1031   ALT 18 01/21/2014 1031   BILITOT 0.4 01/21/2014 1031       ASSESSMENT & PLAN:   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 Patient gets dizzy after phlebotomy;and also patient does not feel any worse prior tophlebotomy.  so recommend holding phlebotomy unless her hematocrit is greater than 50. Today hematocrit is 45. Last phlebotomy more than a year ago.  # shingles- improving.   # follow up in 12 months/labs/possible phlebotomy.   # discussed regarding smoking cessation- patient stated that she has discussed with her PCP multiple times in the past. She is not interested in quitting smoking.     Madeline Sickle, MD 04/05/2016 4:16 PM

## 2016-04-05 NOTE — Progress Notes (Signed)
No changes since last seeing MD

## 2016-04-05 NOTE — Assessment & Plan Note (Addendum)
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 Patient gets dizzy after phlebotomy;and also patient does not feel any worse prior tophlebotomy.  so recommend holding phlebotomy unless her hematocrit is greater than 50. Today hematocrit is 45. Last phlebotomy more than a year ago.  # shingles- improving.   # follow up in 12 months/labs/possible phlebotomy.   # discussed regarding smoking cessation- patient stated that she has discussed with her PCP multiple times in the past. She is not interested in quitting smoking.

## 2016-05-25 DIAGNOSIS — I700 Atherosclerosis of aorta: Secondary | ICD-10-CM | POA: Insufficient documentation

## 2016-05-26 ENCOUNTER — Other Ambulatory Visit: Payer: Self-pay | Admitting: Internal Medicine

## 2016-05-26 DIAGNOSIS — R9389 Abnormal findings on diagnostic imaging of other specified body structures: Secondary | ICD-10-CM

## 2016-06-04 ENCOUNTER — Ambulatory Visit
Admission: RE | Admit: 2016-06-04 | Discharge: 2016-06-04 | Disposition: A | Discharge status: Home / Self Care | Payer: Medicare Other | Source: Ambulatory Visit | Attending: Internal Medicine | Admitting: Internal Medicine

## 2016-06-04 DIAGNOSIS — I700 Atherosclerosis of aorta: Secondary | ICD-10-CM | POA: Diagnosis not present

## 2016-06-04 DIAGNOSIS — R9389 Abnormal findings on diagnostic imaging of other specified body structures: Secondary | ICD-10-CM

## 2016-06-04 DIAGNOSIS — R938 Abnormal findings on diagnostic imaging of other specified body structures: Secondary | ICD-10-CM | POA: Insufficient documentation

## 2016-06-11 ENCOUNTER — Other Ambulatory Visit: Payer: Self-pay | Admitting: Internal Medicine

## 2016-06-11 DIAGNOSIS — Z1231 Encounter for screening mammogram for malignant neoplasm of breast: Secondary | ICD-10-CM

## 2016-06-14 ENCOUNTER — Other Ambulatory Visit: Payer: Self-pay | Admitting: Internal Medicine

## 2016-06-14 DIAGNOSIS — R918 Other nonspecific abnormal finding of lung field: Secondary | ICD-10-CM

## 2016-06-18 ENCOUNTER — Telehealth: Payer: Self-pay | Admitting: Internal Medicine

## 2016-06-18 ENCOUNTER — Ambulatory Visit
Admission: RE | Admit: 2016-06-18 | Discharge: 2016-06-18 | Disposition: A | Discharge status: Home / Self Care | Payer: Medicare Other | Source: Ambulatory Visit | Attending: Internal Medicine | Admitting: Internal Medicine

## 2016-06-18 DIAGNOSIS — I2510 Atherosclerotic heart disease of native coronary artery without angina pectoris: Secondary | ICD-10-CM | POA: Diagnosis not present

## 2016-06-18 DIAGNOSIS — J432 Centrilobular emphysema: Secondary | ICD-10-CM | POA: Insufficient documentation

## 2016-06-18 DIAGNOSIS — R918 Other nonspecific abnormal finding of lung field: Secondary | ICD-10-CM | POA: Diagnosis present

## 2016-06-18 DIAGNOSIS — N8110 Cystocele, unspecified: Secondary | ICD-10-CM | POA: Insufficient documentation

## 2016-06-18 DIAGNOSIS — I700 Atherosclerosis of aorta: Secondary | ICD-10-CM | POA: Insufficient documentation

## 2016-06-18 LAB — GLUCOSE, CAPILLARY: Glucose-Capillary: 161 mg/dL — ABNORMAL HIGH (ref 65–99)

## 2016-06-18 MED ORDER — FLUDEOXYGLUCOSE F - 18 (FDG) INJECTION
12.7400 | Freq: Once | INTRAVENOUS | Status: AC | PRN
  Administered 2016-06-18: 12.74 via INTRAVENOUS

## 2016-06-18 NOTE — Telephone Encounter (Signed)
Raquel Sarna- looks like this pt has new lung mass; having PET today; follow up with Pulmonary re: plan for Biopsy.  Heather/ laura- please have pt see me [place] 11/7 at 11:00 am.   Thx.

## 2016-06-22 ENCOUNTER — Inpatient Hospital Stay: Payer: Medicare Other | Attending: Internal Medicine | Admitting: Internal Medicine

## 2016-06-22 ENCOUNTER — Telehealth: Payer: Self-pay | Admitting: *Deleted

## 2016-06-22 DIAGNOSIS — I10 Essential (primary) hypertension: Secondary | ICD-10-CM | POA: Diagnosis not present

## 2016-06-22 DIAGNOSIS — J449 Chronic obstructive pulmonary disease, unspecified: Secondary | ICD-10-CM

## 2016-06-22 DIAGNOSIS — Z7982 Long term (current) use of aspirin: Secondary | ICD-10-CM | POA: Diagnosis not present

## 2016-06-22 DIAGNOSIS — D751 Secondary polycythemia: Secondary | ICD-10-CM | POA: Diagnosis not present

## 2016-06-22 DIAGNOSIS — Z79899 Other long term (current) drug therapy: Secondary | ICD-10-CM | POA: Insufficient documentation

## 2016-06-22 DIAGNOSIS — R222 Localized swelling, mass and lump, trunk: Secondary | ICD-10-CM | POA: Insufficient documentation

## 2016-06-22 DIAGNOSIS — R918 Other nonspecific abnormal finding of lung field: Secondary | ICD-10-CM | POA: Insufficient documentation

## 2016-06-22 DIAGNOSIS — F17210 Nicotine dependence, cigarettes, uncomplicated: Secondary | ICD-10-CM | POA: Insufficient documentation

## 2016-06-22 NOTE — Assessment & Plan Note (Addendum)
#   LUL mass- highly suspicious for malignancy; recommend biopsy. Awaiting evaluation by pulmonary. Patient is unlikely a surgical candidate. Recommend evaluation with radiation oncology in approximately 2 weeks. Patient is reluctant to proceed with chemotherapy. For now we'll await the biopsy  # Erythrocytosis sec to smoking/COPD.  # follow up in 2 weeks/ also Dr.Crystal referral. Patient will be contacted by Burgess Estelle NP- regarding biopsy planning. She agrees.  # 25 minutes face-to-face with the patient discussing the above plan of care; more than 50% of time spent on prognosis/ natural history; counseling and coordination.  # I reviewed the blood work- with the patient in detail; also reviewed the imaging independently [as summarized above]; and with the patient in detail.   CC: Dr.Klein.

## 2016-06-22 NOTE — Telephone Encounter (Signed)
LMOVM for pt to return call so that I can give her an appt date that has been scheduled.

## 2016-06-22 NOTE — Progress Notes (Signed)
Patient is here for follow up,

## 2016-06-22 NOTE — Telephone Encounter (Signed)
Spoke with pt and gave appt. Nothing further needed.

## 2016-06-22 NOTE — Progress Notes (Signed)
[facility] OFFICE PROGRESS NOTE  Patient Care Team: Adin Hector, MD as PCP - General (Internal Medicine)   SUMMARY OF ONCOLOGIC HISTORY:  # NOV 2017- LUL MASS PET- SUV 12;   # ERYTHROCYTOSIS- likely sec to smoking [Jak-2/exon 12- NEG]  # Hx of smoking- does not want to quit.   INTERVAL HISTORY:  A very pleasant 70 year old female patient with above history of smoking/erthrocytosis- part of physical had CXR/ which was abnormal- and then led to CT scan.    Patient admits that she had recent PFTs- which was significant abnormal as per patient. She is not on oxygen. She unfortunately continues to smoke- in spite of multiple recommendations to quit smoking.  She denies any current symptoms of headaches or vision changes or double vision.  Denies any unusual weight loss or unusual cough or shortness of breath or chest pain.    REVIEW OF SYSTEMS:  A complete 10 point review of system is done which is negative except mentioned above/history of present illness.   PAST MEDICAL HISTORY : No past medical history on file.; HTN; COPD  PAST SURGICAL HISTORY :   Past Surgical History:  Procedure Laterality Date  . BREAST BIOPSY Bilateral    negative    FAMILY HISTORY :   Family History  Problem Relation Age of Onset  . Breast cancer Neg Hx     SOCIAL HISTORY:  History of smoking; denies alcohol abuse.  Social History  Substance Use Topics  . Smoking status: Current Some Day Smoker    Packs/day: 0.50    Years: 55.00  . Smokeless tobacco: Never Used  . Alcohol use 8.4 oz/week    14 Glasses of wine per week    ALLERGIES:  is allergic to augmentin [amoxicillin-pot clavulanate]; penicillins; ace inhibitors; actonel [risedronate sodium]; celexa [citalopram hydrobromide]; ciprofloxacin; dicyclomine hcl; maxzide [triamterene-hctz]; nicoderm [nicotine]; nsaids; oxycodone; and zyban [bupropion].  MEDICATIONS:  Current Outpatient Prescriptions  Medication Sig  Dispense Refill  . albuterol (PROVENTIL HFA;VENTOLIN HFA) 108 (90 Base) MCG/ACT inhaler Inhale into the lungs.    Marland Kitchen alendronate (FOSAMAX) 70 MG tablet Take 70 mg by mouth once a week. Take with a full glass of water on an empty stomach.    Marland Kitchen amLODipine (NORVASC) 2.5 MG tablet Take 2.5 mg by mouth daily.    Marland Kitchen aspirin 81 MG tablet Take 81 mg by mouth daily.    Marland Kitchen azelastine (ASTELIN) 0.1 % nasal spray Place into both nostrils 2 (two) times daily. Use in each nostril as directed    . Cholecalciferol 10000 UNITS CAPS Take 1 capsule by mouth once.    . clonazePAM (KLONOPIN) 0.5 MG tablet Take 0.5 mg by mouth 2 (two) times daily as needed for anxiety.    . fluticasone (FLONASE) 50 MCG/ACT nasal spray Place into both nostrils daily.    . Fluticasone-Salmeterol (ADVAIR) 250-50 MCG/DOSE AEPB Inhale 1 puff into the lungs 2 (two) times daily.    Marland Kitchen omeprazole (PRILOSEC) 20 MG capsule Take 20 mg by mouth daily.    . QUEtiapine (SEROQUEL) 25 MG tablet Take 25 mg by mouth at bedtime.  0   No current facility-administered medications for this visit.     PHYSICAL EXAMINATION:   BP (!) 165/91 (BP Location: Left Arm, Patient Position: Sitting)   Pulse 93   Temp 97.4 F (36.3 C) (Tympanic)   Resp 18   Wt 142 lb 3.2 oz (64.5 kg)   SpO2 97%   BMI 25.19 kg/m  Filed Weights   06/22/16 1428  Weight: 142 lb 3.2 oz (64.5 kg)    GENERAL: Well-nourished well-developed; Alert, no distress and comfortable.  Alone. EYES: no pallor or icterus OROPHARYNX: no thrush or ulceration; dentures.  NECK: supple, no masses felt LYMPH:  no palpable lymphadenopathy in the cervical, axillary or inguinal regions LUNGS: clear to auscultation and  Scattered wheezing HEART/CVS: regular rate & rhythm and no murmurs; No lower extremity edema ABDOMEN:abdomen soft, non-tender and normal bowel sounds Musculoskeletal:no cyanosis of digits and no clubbing  PSYCH: alert & oriented x 3 with fluent speech NEURO: no focal  motor/sensory deficits SKIN:  no rashes or significant lesions  LABORATORY DATA:  I have reviewed the data as listed    Component Value Date/Time   CREATININE 0.77 01/21/2014 1031   PROT 8.4 (H) 01/21/2014 1031   ALBUMIN 3.8 01/21/2014 1031   AST 17 01/21/2014 1031   ALT 18 01/21/2014 1031   ALKPHOS 82 01/21/2014 1031   BILITOT 0.4 01/21/2014 1031   GFRNONAA >60 01/21/2014 1031   GFRAA >60 01/21/2014 1031    No results found for: SPEP, UPEP  Lab Results  Component Value Date   WBC 8.3 04/05/2016   NEUTROABS 5.5 04/05/2016   HGB 16.0 04/05/2016   HCT 45.8 04/05/2016   MCV 107.8 (H) 04/05/2016   PLT 152 04/05/2016      Chemistry      Component Value Date/Time   CREATININE 0.77 01/21/2014 1031      Component Value Date/Time   ALKPHOS 82 01/21/2014 1031   AST 17 01/21/2014 1031   ALT 18 01/21/2014 1031   BILITOT 0.4 01/21/2014 1031       ASSESSMENT & PLAN:   Mass of upper lobe of left lung # LUL mass- highly suspicious for malignancy; recommend biopsy. Awaiting evaluation by pulmonary. Patient is unlikely a surgical candidate. Recommend evaluation with radiation oncology in approximately 2 weeks. Patient is reluctant to proceed with chemotherapy. For now we'll await the biopsy  # Erythrocytosis sec to smoking/COPD.  # follow up in 2 weeks/ also Dr.Crystal referral. Patient will be contacted by Burgess Estelle NP- regarding biopsy planning. She agrees.  # 25 minutes face-to-face with the patient discussing the above plan of care; more than 50% of time spent on prognosis/ natural history; counseling and coordination.  # I reviewed the blood work- with the patient in detail; also reviewed the imaging independently [as summarized above]; and with the patient in detail.   CC: Dr.Klein.      Cammie Sickle, MD 06/22/2016 2:57 PM

## 2016-06-22 NOTE — Telephone Encounter (Signed)
-----   Message from Lieutenant Diego, RN sent at 06/21/2016  4:29 PM EST ----- Regarding: biopsy needed Please see impression below for this patient. Dr. Rogue Bussing is seeing her tomorrow in [place] to go over results. Can someone see her soon to facilitate biopsy? Thanks, Shawn  IMPRESSION: 1. 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 This abuts the mediastinum and extends into the left anterior hilum. Hypermetabolic or pathologic adenopathy is identified. No separate no distant metastatic disease. 2. Other imaging findings of potential clinical significance: Non hypermetabolic thyroid nodules. Coronary, aortic arch, and branch vessel atherosclerotic vascular disease. Centrilobular emphysema. Aortoiliac atherosclerotic vascular disease. Pelvic floor laxity with cystocele. Old right pelvic deformities from prior fractures.

## 2016-06-29 ENCOUNTER — Encounter: Payer: Self-pay | Admitting: Internal Medicine

## 2016-06-29 ENCOUNTER — Ambulatory Visit (INDEPENDENT_AMBULATORY_CARE_PROVIDER_SITE_OTHER): Payer: Medicare Other | Admitting: Internal Medicine

## 2016-06-29 VITALS — BP 158/88 | HR 93 | Ht 63.0 in | Wt 141.0 lb

## 2016-06-29 DIAGNOSIS — R918 Other nonspecific abnormal finding of lung field: Secondary | ICD-10-CM | POA: Diagnosis not present

## 2016-06-29 NOTE — Patient Instructions (Signed)
Plan for ENB/EBUS procedure if patient willing to go through procedure Follow up on oxygen 6MWT

## 2016-06-29 NOTE — Progress Notes (Signed)
Madeline Schmitt Consultation      Date: 06/29/2016,   MRN# [id number] SAYLA GOLONKA 05-20-1946 Code Status:  Code Status History    This patient does not have a recorded code status. Please follow your organizational policy for patients in this situation.     Hosp day:'@[email or web address]'$ @ Referring MD: '@ATDPROV'$ @     PCP:      AdmissionWeight: 141 lb (64 kg)                 CurrentWeight: 141 lb (64 kg) Madeline Schmitt is a 70 y.o. old female seen in consultation for copd and lung mass at the request of Dr. Rogue Bussing.     CHIEF COMPLAINT:   SOB   HISTORY OF PRESENT ILLNESS   70 yo white female seen today for abnormal CT chest Patient is long time smoker, has a dx of copd for several years Patient has chronic SOB with exertion for many years but has worsened over last several months Patient takes advair and albuterol as needed-has been on this for many years She had recent COPD exacerbation 1 month ago requiring prednisone and ABX-she feels better now but states that she was tested for hypoxia and was not given oxygen  Patient had CXR to assess lungs and then subsequently had CT and PET scan CT chest and PET scan reviewed with patient/family images reviewed 06/29/16 There is Left Upper lobe lung Mass with mediastinal adenopathy   The Risks and Benefits of the Bronchoscopy with EBUS and ENB were explained to patient/family and I have discussed the risk for acute bleeding, increased chance of infection, increased chance of respiratory failure and cardiac arrest and death. I have also explained to avoid all types of NSAIDs to decrease chance of bleeding, and to avoid food and drinks the midnight prior to procedure.  The procedure consists of a video camera with a light source to be placed and inserted  into the lungs to  look for abnormal tissue and to obtain tissue samples by using needle and biopsy tools.  The patient/family understand the risks and  benefits and have NOT agreed to proceed with procedure at this time.  They will call us back and make final decision    PAST MEDICAL HISTORY   History reviewed. No pertinent past medical history.  +COPD +COPD  SURGICAL HISTORY   Past Surgical History:  Procedure Laterality Date  . BREAST BIOPSY Bilateral    negative     FAMILY HISTORY   Family History  Problem Relation Age of Onset  . Breast cancer Neg Hx      SOCIAL HISTORY   Social History  Substance Use Topics  . Smoking status: Current Some Day Smoker    Packs/day: 1.00    Years: 55.00  . Smokeless tobacco: Never Used  . Alcohol use 8.4 oz/week    14 Glasses of wine per week     MEDICATIONS    Home Medication:  Current Outpatient Rx  . Order #: [id number] Class: Historical Med  . Order #: [id number] Class: Historical Med  . Order #: [id number] Class: Historical Med  . Order #: [id number] Class: Historical Med  . Order #: [id number] Class: Historical Med  . Order #: [id number] Class: Historical Med  . Order #: [id number] Class: Historical Med  . Order #: [id number] Class: Historical Med  . Order #: [id number] Class: Historical Med    Current Medication:  Current Outpatient Prescriptions:  .  albuterol (PROAIR HFA) 108 (90 Base) MCG/ACT inhaler, Inhale 2 puffs into the  lungs every 6 (six) hours as needed for wheezing or shortness of breath., Disp: , Rfl:  .  amLODipine (NORVASC) 2.5 MG tablet, Take 2.5 mg by mouth daily., Disp: , Rfl:  .  aspirin 81 MG tablet, Take 81 mg by mouth daily., Disp: , Rfl:  .  Cholecalciferol 10000 UNITS CAPS, Take 1 capsule by mouth once., Disp: , Rfl:  .  clonazePAM (KLONOPIN) 0.5 MG tablet, Take 0.5 mg by mouth 2 (two) times daily as needed for anxiety., Disp: , Rfl:  .  fluticasone (FLONASE) 50 MCG/ACT nasal spray, Place into both nostrils daily., Disp: , Rfl:  .  Fluticasone-Salmeterol (ADVAIR) 250-50 MCG/DOSE AEPB, Inhale 1 puff into the lungs 2 (two) times daily., Disp: , Rfl:  .   omeprazole (PRILOSEC) 20 MG capsule, Take 20 mg by mouth daily., Disp: , Rfl:  .  QUEtiapine (SEROQUEL) 25 MG tablet, Take 25 mg by mouth at bedtime., Disp: , Rfl: 0    ALLERGIES   Augmentin [amoxicillin-pot clavulanate]; Penicillins; Ace inhibitors; Actonel [risedronate sodium]; Celexa [citalopram hydrobromide]; Ciprofloxacin; Dicyclomine hcl; Maxzide [triamterene-hctz]; Nicoderm [nicotine]; Nsaids; Oxycodone; and Zyban [bupropion]     REVIEW OF SYSTEMS   Review of Systems  Constitutional: Negative for chills, diaphoresis, fever, malaise/fatigue and weight loss.  HENT: Negative for congestion and hearing loss.   Eyes: Negative for blurred vision and double vision.  Respiratory: Positive for cough, shortness of breath and wheezing. Negative for hemoptysis and sputum production.   Cardiovascular: Negative for chest pain, palpitations and orthopnea.  Gastrointestinal: Negative for abdominal pain, heartburn, nausea and vomiting.  Genitourinary: Negative for dysuria and urgency.  Musculoskeletal: Negative for myalgias.  Skin: Negative for rash.  Neurological: Negative for dizziness and weakness.  Endo/Heme/Allergies: Does not bruise/bleed easily.  Psychiatric/Behavioral: Negative for depression.  All other systems reviewed and are negative.    VS: BP (!) 158/88 (BP Location: Left Arm, Cuff Size: Normal)   Pulse 93   Ht '5\' 3"'$  (1.6 m)   Wt 141 lb (64 kg)   SpO2 96%   BMI 24.98 kg/m      PHYSICAL EXAM  Physical Exam  Constitutional: She is oriented to person, place, and time. She appears well-developed and well-nourished. No distress.  HENT:  Head: Normocephalic and atraumatic.  Mouth/Throat: No oropharyngeal exudate.  Eyes: EOM are normal. Pupils are equal, round, and reactive to light. No scleral icterus.  Neck: Normal range of motion. Neck supple.  Cardiovascular: Normal rate, regular rhythm and normal heart sounds.   No murmur heard. Pulmonary/Chest: No stridor. No  respiratory distress. She has no wheezes.  Abdominal: Soft. Bowel sounds are normal.  Musculoskeletal: Normal range of motion. She exhibits no edema.  Neurological: She is alert and oriented to person, place, and time. No cranial nerve deficit.  Skin: Skin is warm. She is not diaphoretic.  Psychiatric: She has a normal mood and affect.       IMAGING    Ct Chest Wo Contrast  Result Date: 06/04/2016 CLINICAL DATA:  Shortness of breath on exertion. EXAM: CT CHEST WITHOUT CONTRAST TECHNIQUE: Multidetector CT imaging of the chest was performed following the standard protocol without IV contrast. COMPARISON:  CT scan of May 27, 2014. FINDINGS: Cardiovascular: Atherosclerosis of thoracic aorta is noted without aneurysm formation. Coronary artery calcifications are noted. Mediastinum/Nodes: 1.6 cm left hilar lymph node is noted. Lungs/Pleura: 5.3 x 2.9 x 2.5 cm irregular mass is seen medially in the left upper lobe with pleural tail consistent with malignancy. No pneumothorax  or pleural effusion is noted. Biapical scarring is noted. Upper Abdomen: No definite abnormality seen in the visualized portion of upper abdomen. Musculoskeletal: No significant osseous abnormality is noted. IMPRESSION: Aortic atherosclerosis. 5.3 x 2.9 x 2.5 cm irregular mass seen medially in left upper lobe with pleural tail consistent with malignancy. 1.6 cm left hilar lymph node is noted concerning for metastatic disease. PET scan is recommended for further evaluation. These results will be called to the ordering clinician or representative by the Radiologist Assistant, and communication documented in the PACS or zVision Dashboard. Electronically Signed   By: Marijo Conception, M.D.   On: 06/04/2016 16:12   Nm Pet Image Initial (pi) Skull Base To Thigh  Result Date: 06/18/2016 CLINICAL DATA:  Initial treatment strategy for left upper lobe lung mass. EXAM: NUCLEAR [facility] PET SKULL BASE TO THIGH TECHNIQUE: 12.7 mCi F-18 FDG  was injected intravenously. Full-ring PET imaging was performed from the skull base to thigh after the radiotracer. CT data was obtained and used for attenuation correction and anatomic localization. FASTING BLOOD GLUCOSE:  Value: 161 mg/dl COMPARISON:  Chest CT from 06/04/2016 FINDINGS: NECK No hypermetabolic lymph nodes in the neck. Several thyroid nodules are present but are not hypermetabolic. CHEST The left upper lobe mass has a maximum standard uptake value of 12.0. This lesion abuts the mediastinum and extends to the anterior left hilum no separate hypermetabolic adenopathy in the chest. Coronary, aortic arch, and branch vessel atherosclerotic vascular disease. Centrilobular emphysema. Mild scarring in the lingula and anteriorly in the left lower lobe. ABDOMEN/PELVIS Faintly accentuated metabolic activity in the right adnexa, maximum SUV 4.8. Notable accessory spleen. Aortoiliac atherosclerotic vascular disease. SKELETON No focal hypermetabolic activity to suggest skeletal metastasis. Old bony deformities of the right pubic rami including inferior pubic ramus nonunion and superior pubic rami deformity extending into the pubic body. Degenerative chondral thinning in the hips. Pelvic floor laxity with cystocele. IMPRESSION: 1. 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 This abuts the mediastinum and extends into the left anterior hilum. Hypermetabolic or pathologic adenopathy is identified. No separate no distant metastatic disease. 2. Other imaging findings of potential clinical significance: Non hypermetabolic thyroid nodules. Coronary, aortic arch, and branch vessel atherosclerotic vascular disease. Centrilobular emphysema. Aortoiliac atherosclerotic vascular disease. Pelvic floor laxity with cystocele. Old right pelvic deformities from prior fractures. Electronically Signed   By: Van Clines M.D.   On: 06/18/2016 14:47     Images reviewed  with patient/daighter on 06/29/16 Left lung mass seen  PFT's in PCP office shows Fev1 36% 0.73L Ratio 40% DLCO 42% With air trapping and hyperinflation Impression:severe obstructive ling disease with hyperinflation and airtrapping with severe diffusion impairment  6MWT revealed hypoxia at 87% 3 minutes   ASSESSMENT/PLAN   70 yo white female with signs of Gold Stage D severe COPD with presumed chronic hypoxic resp failure with Left Lung mass and mediastinal adenopathy that is most likely c/w primary lung cancer.  1.pateint will likely need oxygen for her COPD 2.continue advair and albuterol as needed 3.Left lung mass with adenopathy-will need further diagnostic testing 4.smoking cessation strongly advised    The Risks and Benefits of the Bronchoscopy with EBUS/ENB were explained to patient/family and I have discussed the risk for acute bleeding, increased chance of infection, increased chance of respiratory failure and cardiac arrest and death. I have also explained to avoid all types of NSAIDs to decrease chance of bleeding, and to avoid food  and drinks the midnight prior to procedure.  The procedure consists of a video camera with a light source to be placed and inserted  into the lungs to  look for abnormal tissue and to obtain tissue samples by using needle and biopsy tools.  The patient/family understand the risks and benefits and have NOT  agreed to proceed with procedure at this time Will wait for their call to set up time/date  Patient is at very high risk for pulmonary and cardiac complications from procedure in setting of Severe COPD and Active smoking    I have personally obtained a history, examined the patient, evaluated laboratory and independently reviewed imaging results, formulated the assessment and plan and placed orders.  The Patient requires high complexity decision making for assessment and support, frequent evaluation and titration of therapies, application  of advanced monitoring technologies and extensive interpretation of multiple databases.   Patient/Family are satisfied with Plan of action and management. All questions answered  Corrin Parker, M.D.  [facility]  Medical Director [place] Director [facility] Cardio-Pulmonary Department

## 2016-07-01 ENCOUNTER — Telehealth: Payer: Self-pay | Admitting: *Deleted

## 2016-07-01 DIAGNOSIS — J449 Chronic obstructive pulmonary disease, unspecified: Secondary | ICD-10-CM

## 2016-07-01 NOTE — Telephone Encounter (Signed)
SMW results received. Per DK order ONO and O2 @ 2L with exertion. Pt informed. Orders placed. Nothing further needed.

## 2016-07-07 ENCOUNTER — Inpatient Hospital Stay (HOSPITAL_BASED_OUTPATIENT_CLINIC_OR_DEPARTMENT_OTHER): Payer: Medicare Other | Admitting: Internal Medicine

## 2016-07-07 ENCOUNTER — Ambulatory Visit
Admission: RE | Admit: 2016-07-07 | Discharge: 2016-07-07 | Disposition: A | Discharge status: Home / Self Care | Payer: Medicare Other | Source: Ambulatory Visit | Attending: Radiation Oncology | Admitting: Radiation Oncology

## 2016-07-07 VITALS — BP 134/89 | HR 86 | Temp 97.1°F | Resp 18 | Wt 142.0 lb

## 2016-07-07 VITALS — BP 162/89 | HR 94 | Temp 98.4°F | Resp 22 | Wt 141.6 lb

## 2016-07-07 DIAGNOSIS — D751 Secondary polycythemia: Secondary | ICD-10-CM

## 2016-07-07 DIAGNOSIS — Z7982 Long term (current) use of aspirin: Secondary | ICD-10-CM

## 2016-07-07 DIAGNOSIS — R05 Cough: Secondary | ICD-10-CM | POA: Diagnosis not present

## 2016-07-07 DIAGNOSIS — Z79899 Other long term (current) drug therapy: Secondary | ICD-10-CM

## 2016-07-07 DIAGNOSIS — Z510 Encounter for antineoplastic radiation therapy: Secondary | ICD-10-CM | POA: Diagnosis not present

## 2016-07-07 DIAGNOSIS — R0602 Shortness of breath: Secondary | ICD-10-CM | POA: Diagnosis not present

## 2016-07-07 DIAGNOSIS — C3412 Malignant neoplasm of upper lobe, left bronchus or lung: Secondary | ICD-10-CM

## 2016-07-07 DIAGNOSIS — C3411 Malignant neoplasm of upper lobe, right bronchus or lung: Secondary | ICD-10-CM | POA: Insufficient documentation

## 2016-07-07 DIAGNOSIS — F17210 Nicotine dependence, cigarettes, uncomplicated: Secondary | ICD-10-CM | POA: Insufficient documentation

## 2016-07-07 DIAGNOSIS — R222 Localized swelling, mass and lump, trunk: Secondary | ICD-10-CM | POA: Diagnosis not present

## 2016-07-07 DIAGNOSIS — J449 Chronic obstructive pulmonary disease, unspecified: Secondary | ICD-10-CM

## 2016-07-07 DIAGNOSIS — J432 Centrilobular emphysema: Secondary | ICD-10-CM | POA: Diagnosis not present

## 2016-07-07 DIAGNOSIS — R918 Other nonspecific abnormal finding of lung field: Secondary | ICD-10-CM | POA: Diagnosis not present

## 2016-07-07 DIAGNOSIS — I10 Essential (primary) hypertension: Secondary | ICD-10-CM

## 2016-07-07 NOTE — Assessment & Plan Note (Signed)
#   LUL mass- highly suspicious for malignancy. patient declined biopsy- because of the concerns for procedural complications. Discussed with Dr. Donella Stade- given the high suspicion for malignancy; and the patient's reluctance with biopsy; would recommend radiation for palliative reasons. We'll attempt at a liquid biopsy.   # COPD- recommend using proair q 4-6 hours; continue advair.   # Erythrocytosis sec to smoking/COPD.  # dec1st- liquid testing.   # Follow up with me in 6 weeks/ cbc-bmp.

## 2016-07-07 NOTE — Consult Note (Signed)
NEW PATIENT EVALUATION  Name: Madeline Schmitt  MRN: [id number]  Date:   07/07/2016     DOB: 1945/11/13   This 70 y.o. 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 Lung Cancer    Pt is here for initial consultation of lung mass    DIAGNOSIS: The encounter diagnosis was Mass of upper lobe of left lung.   PREVIOUS INVESTIGATIONS:  CT and PET/CT scans reviewed Clinical notes reviewed No biopsy performed based on patient's wishes  HPI: Patient is a 70 year old female presented with increasing this dyspnea on exertion and underwent CT scan showing a 5.3 x 2.9 cm irregular mass in the left upper lobe consistent with malignancy. No mediastinal adenopathy or disease outside of the chest was identified. PET CT scan confirmed hypermetabolic activity with SUV of 12.0 compatible with malignancy. Patient does have centrilobular emphysema. She has poor pulmonary function tests and was recently seen by pulmonology were recommendation for bronchoscopy with EBU S was recommended. Patient is adamantly denied consent for bronchoscopy. She also adamantly denies chemotherapy. She has a mild cough no hemoptysis. I been asked to evaluate the patient for consideration of radiation therapy with curative intent. Tumor is centrally placed and does abut the aortic arch making hypofractionated SB RT contraindicated. Patient is been followed for years for erythrocytosis by medical oncology.  PLANNED TREATMENT REGIMEN: IMRT radiation therapy  PAST MEDICAL HISTORY:  has no past medical history on file.    PAST SURGICAL HISTORY:  Past Surgical History:  Procedure Laterality Date  . BREAST BIOPSY Bilateral    negative    FAMILY HISTORY: family history is not on file.  SOCIAL HISTORY:  reports that she has been  smoking.  She has a 55.00 pack-year smoking history. She has never used smokeless tobacco. She reports that she drinks about 8.4 oz of alcohol per week . She reports that she does not use drugs.  ALLERGIES: Augmentin [amoxicillin-pot clavulanate]; Penicillins; Ace inhibitors; Actonel [risedronate sodium]; Celexa [citalopram hydrobromide]; Ciprofloxacin; Dicyclomine hcl; Maxzide [triamterene-hctz]; Nicoderm [nicotine]; Nsaids; Oxycodone; and Zyban [bupropion]  MEDICATIONS:  Current Outpatient Prescriptions  Medication Sig Dispense Refill  . albuterol (PROAIR HFA) 108 (90 Base) MCG/ACT inhaler Inhale 2 puffs into the lungs every 6 (six) hours as needed for wheezing or shortness of breath.    Marland Kitchen amLODipine (NORVASC) 2.5 MG tablet Take 2.5 mg by mouth daily.    Marland Kitchen aspirin 81 MG tablet Take 81 mg by mouth daily.    Marland Kitchen atenolol (TENORMIN) 100 MG tablet   0  . Cholecalciferol 10000 UNITS CAPS Take 1 capsule by mouth once.    . clonazePAM (KLONOPIN) 0.5 MG tablet Take 0.5 mg by mouth 2 (two) times daily as needed for anxiety.    . fluticasone (FLONASE) 50 MCG/ACT nasal spray Place into both nostrils daily.    . Fluticasone-Salmeterol (ADVAIR) 250-50 MCG/DOSE AEPB Inhale 1 puff into the lungs 2 (two) times daily.    Marland Kitchen omeprazole (PRILOSEC) 20 MG capsule Take 20 mg by mouth daily.    . QUEtiapine (SEROQUEL) 25 MG tablet Take 25 mg by mouth at bedtime.  0  . sulfamethoxazole-trimethoprim (BACTRIM DS,SEPTRA DS) 800-160 MG tablet Take by mouth.     No current facility-administered medications for this encounter.     ECOG PERFORMANCE  STATUS:  1 - Symptomatic but completely ambulatory  REVIEW OF SYSTEMS: Except for minor chronic cough and dyspnea on exertion Patient denies any weight loss, fatigue, weakness, fever, chills or night sweats. Patient denies any loss of vision, blurred vision. Patient denies any ringing  of the ears or hearing loss. No irregular heartbeat. Patient denies heart murmur or history of  fainting. Patient denies any chest pain or pain radiating to her upper extremities. Patient denies any shortness of breath, difficulty breathing at night, cough or hemoptysis. Patient denies any swelling in the lower legs. Patient denies any nausea vomiting, vomiting of blood, or coffee ground material in the vomitus. Patient denies any stomach pain. Patient states has had normal bowel movements no significant constipation or diarrhea. Patient denies any dysuria, hematuria or significant nocturia. Patient denies any problems walking, swelling in the joints or loss of balance. Patient denies any skin changes, loss of hair or loss of weight. Patient denies any excessive worrying or anxiety or significant depression. Patient denies any problems with insomnia. Patient denies excessive thirst, polyuria, polydipsia. Patient denies any swollen glands, patient denies easy bruising or easy bleeding. Patient denies any recent infections, allergies or URI. Patient "s visual fields have not changed significantly in recent time.    PHYSICAL EXAM: BP (!) 162/89   Pulse 94   Temp 98.4 F (36.9 C)   Resp (!) 22   Wt 141 lb 10.3 oz (64.3 kg)   BMI 25.09 kg/m  No cervical or supra clavicular adenopathy is identified. Well-developed well-nourished patient in NAD. HEENT reveals PERLA, EOMI, discs not visualized.  Oral cavity is clear. No oral mucosal lesions are identified. Neck is clear without evidence of cervical or supraclavicular adenopathy. Lungs are clear to A&P. Cardiac examination is essentially unremarkable with regular rate and rhythm without murmur rub or thrill. Abdomen is benign with no organomegaly or masses noted. Motor sensory and DTR levels are equal and symmetric in the upper and lower extremities. Cranial nerves II through XII are grossly intact. Proprioception is intact. No peripheral adenopathy or edema is identified. No motor or sensory levels are noted. Crude visual fields are within normal  range.  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 This is based on the progression of disease over time as well as hypermetabolic activity on PET CT scan. Patient refuses biopsy as well as chemotherapy. I have recommended radiation therapy to her left hilum. I would plan on delivering 7000 cGy over 7 weeks using I MRT radiation therapy. I which use I MRT based on the close proximity to the esophagus heart and spinal cord. Also poor lung makes a strong case for again I MRT treatment. Risks and benefits of treatment including possible radiation esophagitis, fatigue alteration of blood counts increased cough and skin reaction all were discussed in detail with the patient. She is consented to move forward with radiation therapy and I personally ordered CT simulation for next week.  I would like to take this opportunity to thank you for allowing me to participate in the care of your patient.Armstead Peaks., MD

## 2016-07-07 NOTE — Assessment & Plan Note (Deleted)
#   LUL mass- highly suspicious for malignancy. patient declined biopsy- because of the concerns for procedural complications. Discussed with Dr. Donella Stade- given the high suspicion for malignancy; and the patient's reluctance with biopsy; would recommend radiation for palliative reasons. We'll attempt at a liquid biopsy.   # COPD- recommend using proair q 4-6 hours; continue advair.   # Erythrocytosis sec to smoking/COPD.  # dec1st- liquid testing.   # Follow up with me in 6 weeks/ cbc-bmp.

## 2016-07-07 NOTE — Progress Notes (Signed)
[place] OFFICE PROGRESS NOTE  Patient Care Team: Adin Hector, MD as PCP - General (Internal Medicine)   SUMMARY OF ONCOLOGIC HISTORY:  Oncology History   # NOV 2017- LUL MASS PET- SUV 12; Pt DECLINED BIOPSY. DEC 1st 2017- RT [Dr.Crystal]  # ERYTHROCYTOSIS- likely sec to smoking [Jak-2/exon 12- NEG]  # Hx of smoking- does not want to quit.      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 Patient declined given the concerns of complications from biopsy. She unfortunately continues to smoke- in spite of multiple recommendations to quit smoking. She denies any current symptoms of headaches or vision changes or double vision.  Denies any unusual weight loss or unusual cough or hemoptysis. She has chronic shortness of breath.   REVIEW OF SYSTEMS:  A complete 10 point review of system is done which is negative except mentioned above/history of present illness.   PAST MEDICAL HISTORY : No past medical history on file.; HTN; COPD  PAST SURGICAL HISTORY :   Past Surgical History:  Procedure Laterality Date  . BREAST BIOPSY Bilateral    negative    FAMILY HISTORY :   Family History  Problem Relation Age of Onset  . Breast cancer Neg Hx     SOCIAL HISTORY:  History of smoking; denies alcohol abuse.  Social History  Substance Use Topics  . Smoking status: Current Some Day Smoker    Packs/day: 1.00    Years: 55.00  . Smokeless tobacco: Never Used  . Alcohol use 8.4 oz/week    14 Glasses of wine per week    ALLERGIES:  is allergic to augmentin [amoxicillin-pot clavulanate]; penicillins; ace inhibitors; actonel [risedronate sodium]; celexa [citalopram  hydrobromide]; ciprofloxacin; dicyclomine hcl; maxzide [triamterene-hctz]; nicoderm [nicotine]; nsaids; oxycodone; and zyban [bupropion].  MEDICATIONS:  Current Outpatient Prescriptions  Medication Sig Dispense Refill  . albuterol (PROAIR HFA) 108 (90 Base) MCG/ACT inhaler Inhale 2 puffs into the lungs every 6 (six) hours as needed for wheezing or shortness of breath.    Marland Kitchen amLODipine (NORVASC) 2.5 MG tablet Take 2.5 mg by mouth daily.    Marland Kitchen aspirin 81 MG tablet Take 81 mg by mouth daily.    Marland Kitchen atenolol (TENORMIN) 100 MG tablet   0  . Cholecalciferol 10000 UNITS CAPS Take 1 capsule by mouth once.    . clonazePAM (KLONOPIN) 0.5 MG tablet Take 0.5 mg by mouth 2 (two) times daily as needed for anxiety.    . fluticasone (FLONASE) 50 MCG/ACT nasal spray Place into both nostrils daily.    . Fluticasone-Salmeterol (ADVAIR) 250-50 MCG/DOSE AEPB Inhale 1 puff into the lungs 2 (two) times daily.    Marland Kitchen omeprazole (PRILOSEC) 20 MG capsule Take 20 mg by mouth daily.    . QUEtiapine (SEROQUEL) 25 MG tablet Take 25 mg by mouth at bedtime.  0  . sulfamethoxazole-trimethoprim (BACTRIM DS,SEPTRA DS) 800-160 MG tablet Take by mouth.     No current facility-administered medications for this visit.     PHYSICAL EXAMINATION:   BP 134/89 (BP Location: Left Arm, Patient Position: Sitting)   Pulse 86   Temp 97.1 F (36.2 C) (Tympanic)   Resp 18  Wt 142 lb (64.4 kg)   SpO2 94%   BMI 25.15 kg/m   Filed Weights   07/07/16 1459  Weight: 142 lb (64.4 kg)    GENERAL: Well-nourished well-developed; Alert, no distress and comfortable.  Alone. EYES: no pallor or icterus OROPHARYNX: no thrush or ulceration; dentures.  NECK: supple, no masses felt LYMPH:  no palpable lymphadenopathy in the cervical, axillary or inguinal regions LUNGS: Decreased breath sounds to auscultation and  Scattered wheezing HEART/CVS: regular rate & rhythm and no murmurs; No lower extremity edema ABDOMEN:abdomen soft, non-tender and  normal bowel sounds Musculoskeletal:no cyanosis of digits and no clubbing  PSYCH: alert & oriented x 3 with fluent speech NEURO: no focal motor/sensory deficits SKIN:  no rashes or significant lesions  LABORATORY DATA:  I have reviewed the data as listed    Component Value Date/Time   CREATININE 0.77 01/21/2014 1031   PROT 8.4 (H) 01/21/2014 1031   ALBUMIN 3.8 01/21/2014 1031   AST 17 01/21/2014 1031   ALT 18 01/21/2014 1031   ALKPHOS 82 01/21/2014 1031   BILITOT 0.4 01/21/2014 1031   GFRNONAA >60 01/21/2014 1031   GFRAA >60 01/21/2014 1031    No results found for: SPEP, UPEP  Lab Results  Component Value Date   WBC 8.3 04/05/2016   NEUTROABS 5.5 04/05/2016   HGB 16.0 04/05/2016   HCT 45.8 04/05/2016   MCV 107.8 (H) 04/05/2016   PLT 152 04/05/2016      Chemistry      Component Value Date/Time   CREATININE 0.77 01/21/2014 1031      Component Value Date/Time   ALKPHOS 82 01/21/2014 1031   AST 17 01/21/2014 1031   ALT 18 01/21/2014 1031   BILITOT 0.4 01/21/2014 1031       ASSESSMENT & PLAN:   Cancer of upper lobe of left lung ([place]) # LUL mass- highly suspicious for malignancy. patient declined biopsy- because of the concerns for procedural complications. Discussed with Dr. Donella Stade- given the high suspicion for malignancy; and the patient's reluctance with biopsy; would recommend radiation for palliative reasons. We'll attempt at a liquid biopsy.   # COPD- recommend using proair q 4-6 hours; continue advair.   # Erythrocytosis sec to smoking/COPD.  # dec1st- liquid testing.   # Follow up with me in 6 weeks/ cbc-bmp.      Cammie Sickle, MD 07/07/2016 4:25 PM

## 2016-07-07 NOTE — Progress Notes (Signed)
Patient is here for follow up, she is doing ok

## 2016-07-13 ENCOUNTER — Encounter: Payer: Self-pay | Admitting: Internal Medicine

## 2016-07-13 ENCOUNTER — Telehealth: Payer: Self-pay | Admitting: Internal Medicine

## 2016-07-13 ENCOUNTER — Encounter: Payer: Self-pay | Admitting: *Deleted

## 2016-07-13 DIAGNOSIS — J449 Chronic obstructive pulmonary disease, unspecified: Secondary | ICD-10-CM

## 2016-07-13 NOTE — Telephone Encounter (Signed)
Cannot run guardiant testing/ liquid biopsy- as patient is not stage III or stage IV. This test will be canceled at this time

## 2016-07-14 ENCOUNTER — Telehealth: Payer: Self-pay | Admitting: *Deleted

## 2016-07-14 NOTE — Telephone Encounter (Signed)
Heather notified pt.

## 2016-07-14 NOTE — Telephone Encounter (Signed)
Left msg for patient to call cancer center.  When pt calls back will need to discuss gaurdant testing.  Per md, pt will not qualify for testing due to potential suspected dx of stage 1or 2 disease.  Will need to cnl gaurdant blood test on Friday.

## 2016-07-14 NOTE — Telephone Encounter (Signed)
Patient instructed re: cnl the guardant testing.

## 2016-07-15 ENCOUNTER — Telehealth: Payer: Self-pay | Admitting: *Deleted

## 2016-07-15 NOTE — Telephone Encounter (Signed)
LMOVM for pt to return call for results. 

## 2016-07-16 ENCOUNTER — Ambulatory Visit: Payer: No Typology Code available for payment source

## 2016-07-16 ENCOUNTER — Inpatient Hospital Stay: Payer: Medicare Other

## 2016-07-16 ENCOUNTER — Ambulatory Visit
Admission: RE | Admit: 2016-07-16 | Discharge: 2016-07-16 | Disposition: A | Discharge status: Home / Self Care | Payer: Medicare Other | Source: Ambulatory Visit | Attending: Radiation Oncology | Admitting: Radiation Oncology

## 2016-07-16 DIAGNOSIS — Z510 Encounter for antineoplastic radiation therapy: Secondary | ICD-10-CM | POA: Diagnosis not present

## 2016-07-16 NOTE — Telephone Encounter (Signed)
Spoke with pt and informed of ONO results. Pt states [facility] has her setup her machine and she is using 2L QHS. Nothing further needed.

## 2016-07-19 DIAGNOSIS — Z510 Encounter for antineoplastic radiation therapy: Secondary | ICD-10-CM | POA: Diagnosis not present

## 2016-07-22 ENCOUNTER — Other Ambulatory Visit: Payer: Self-pay | Admitting: *Deleted

## 2016-07-22 DIAGNOSIS — C3412 Malignant neoplasm of upper lobe, left bronchus or lung: Secondary | ICD-10-CM

## 2016-07-28 ENCOUNTER — Ambulatory Visit
Admission: RE | Admit: 2016-07-28 | Discharge: 2016-07-28 | Disposition: A | Discharge status: Home / Self Care | Payer: Medicare Other | Source: Ambulatory Visit | Attending: Radiation Oncology | Admitting: Radiation Oncology

## 2016-07-28 DIAGNOSIS — Z510 Encounter for antineoplastic radiation therapy: Secondary | ICD-10-CM | POA: Diagnosis not present

## 2016-07-29 ENCOUNTER — Ambulatory Visit
Admission: RE | Admit: 2016-07-29 | Discharge: 2016-07-29 | Disposition: A | Discharge status: Home / Self Care | Payer: Medicare Other | Source: Ambulatory Visit | Attending: Radiation Oncology | Admitting: Radiation Oncology

## 2016-07-29 DIAGNOSIS — Z510 Encounter for antineoplastic radiation therapy: Secondary | ICD-10-CM | POA: Diagnosis not present

## 2016-07-30 ENCOUNTER — Ambulatory Visit
Admission: RE | Admit: 2016-07-30 | Discharge: 2016-07-30 | Disposition: A | Discharge status: Home / Self Care | Payer: Medicare Other | Source: Ambulatory Visit | Attending: Radiation Oncology | Admitting: Radiation Oncology

## 2016-07-30 DIAGNOSIS — Z510 Encounter for antineoplastic radiation therapy: Secondary | ICD-10-CM | POA: Diagnosis not present

## 2016-08-02 ENCOUNTER — Ambulatory Visit
Admission: RE | Admit: 2016-08-02 | Discharge: 2016-08-02 | Disposition: A | Discharge status: Home / Self Care | Payer: Medicare Other | Source: Ambulatory Visit | Attending: Radiation Oncology | Admitting: Radiation Oncology

## 2016-08-02 DIAGNOSIS — Z510 Encounter for antineoplastic radiation therapy: Secondary | ICD-10-CM | POA: Diagnosis not present

## 2016-08-03 ENCOUNTER — Ambulatory Visit
Admission: RE | Admit: 2016-08-03 | Discharge: 2016-08-03 | Disposition: A | Discharge status: Home / Self Care | Payer: Medicare Other | Source: Ambulatory Visit | Attending: Radiation Oncology | Admitting: Radiation Oncology

## 2016-08-03 DIAGNOSIS — Z510 Encounter for antineoplastic radiation therapy: Secondary | ICD-10-CM | POA: Diagnosis not present

## 2016-08-04 ENCOUNTER — Ambulatory Visit
Admission: RE | Admit: 2016-08-04 | Discharge: 2016-08-04 | Disposition: A | Discharge status: Home / Self Care | Payer: Medicare Other | Source: Ambulatory Visit | Attending: Radiation Oncology | Admitting: Radiation Oncology

## 2016-08-04 DIAGNOSIS — Z510 Encounter for antineoplastic radiation therapy: Secondary | ICD-10-CM | POA: Diagnosis not present

## 2016-08-05 ENCOUNTER — Ambulatory Visit
Admission: RE | Admit: 2016-08-05 | Discharge: 2016-08-05 | Disposition: A | Discharge status: Home / Self Care | Payer: Medicare Other | Source: Ambulatory Visit | Attending: Radiation Oncology | Admitting: Radiation Oncology

## 2016-08-05 DIAGNOSIS — Z510 Encounter for antineoplastic radiation therapy: Secondary | ICD-10-CM | POA: Diagnosis not present

## 2016-08-06 ENCOUNTER — Ambulatory Visit
Admission: RE | Admit: 2016-08-06 | Discharge: 2016-08-06 | Disposition: A | Discharge status: Home / Self Care | Payer: Medicare Other | Source: Ambulatory Visit | Attending: Radiation Oncology | Admitting: Radiation Oncology

## 2016-08-06 DIAGNOSIS — Z510 Encounter for antineoplastic radiation therapy: Secondary | ICD-10-CM | POA: Diagnosis not present

## 2016-08-10 ENCOUNTER — Ambulatory Visit
Admission: RE | Admit: 2016-08-10 | Discharge: 2016-08-10 | Disposition: A | Discharge status: Home / Self Care | Payer: Medicare Other | Source: Ambulatory Visit | Attending: Radiation Oncology | Admitting: Radiation Oncology

## 2016-08-10 DIAGNOSIS — Z510 Encounter for antineoplastic radiation therapy: Secondary | ICD-10-CM | POA: Diagnosis not present

## 2016-08-11 ENCOUNTER — Ambulatory Visit
Admission: RE | Admit: 2016-08-11 | Discharge: 2016-08-11 | Disposition: A | Discharge status: Home / Self Care | Payer: Medicare Other | Source: Ambulatory Visit | Attending: Radiation Oncology | Admitting: Radiation Oncology

## 2016-08-11 ENCOUNTER — Inpatient Hospital Stay: Payer: Medicare Other | Attending: Radiation Oncology

## 2016-08-11 DIAGNOSIS — C3412 Malignant neoplasm of upper lobe, left bronchus or lung: Secondary | ICD-10-CM

## 2016-08-11 DIAGNOSIS — R918 Other nonspecific abnormal finding of lung field: Secondary | ICD-10-CM | POA: Diagnosis not present

## 2016-08-11 DIAGNOSIS — Z510 Encounter for antineoplastic radiation therapy: Secondary | ICD-10-CM | POA: Diagnosis not present

## 2016-08-11 LAB — CBC
HEMATOCRIT: 42.9 % (ref 35.0–47.0)
HEMOGLOBIN: 15.2 g/dL (ref 12.0–16.0)
MCH: 38.1 pg — AB (ref 26.0–34.0)
MCHC: 35.4 g/dL (ref 32.0–36.0)
MCV: 107.7 fL — AB (ref 80.0–100.0)
Platelets: 118 10*3/uL — ABNORMAL LOW (ref 150–440)
RBC: 3.98 MIL/uL (ref 3.80–5.20)
RDW: 13.6 % (ref 11.5–14.5)
WBC: 5.8 10*3/uL (ref 3.6–11.0)

## 2016-08-12 ENCOUNTER — Ambulatory Visit
Admission: RE | Admit: 2016-08-12 | Discharge: 2016-08-12 | Disposition: A | Discharge status: Home / Self Care | Payer: Medicare Other | Source: Ambulatory Visit | Attending: Radiation Oncology | Admitting: Radiation Oncology

## 2016-08-12 DIAGNOSIS — Z510 Encounter for antineoplastic radiation therapy: Secondary | ICD-10-CM | POA: Diagnosis not present

## 2016-08-13 ENCOUNTER — Ambulatory Visit
Admission: RE | Admit: 2016-08-13 | Discharge: 2016-08-13 | Disposition: A | Discharge status: Home / Self Care | Payer: Medicare Other | Source: Ambulatory Visit | Attending: Radiation Oncology | Admitting: Radiation Oncology

## 2016-08-13 DIAGNOSIS — Z510 Encounter for antineoplastic radiation therapy: Secondary | ICD-10-CM | POA: Diagnosis not present

## 2016-08-17 ENCOUNTER — Ambulatory Visit
Admission: RE | Admit: 2016-08-17 | Discharge: 2016-08-17 | Disposition: A | Discharge status: Home / Self Care | Payer: Medicare Other | Source: Ambulatory Visit | Attending: Radiation Oncology | Admitting: Radiation Oncology

## 2016-08-17 DIAGNOSIS — C3411 Malignant neoplasm of upper lobe, right bronchus or lung: Secondary | ICD-10-CM | POA: Diagnosis not present

## 2016-08-17 DIAGNOSIS — R0602 Shortness of breath: Secondary | ICD-10-CM | POA: Diagnosis not present

## 2016-08-17 DIAGNOSIS — R05 Cough: Secondary | ICD-10-CM | POA: Diagnosis not present

## 2016-08-17 DIAGNOSIS — Z79899 Other long term (current) drug therapy: Secondary | ICD-10-CM | POA: Diagnosis not present

## 2016-08-17 DIAGNOSIS — D751 Secondary polycythemia: Secondary | ICD-10-CM | POA: Diagnosis not present

## 2016-08-17 DIAGNOSIS — F17210 Nicotine dependence, cigarettes, uncomplicated: Secondary | ICD-10-CM | POA: Diagnosis not present

## 2016-08-17 DIAGNOSIS — Z7982 Long term (current) use of aspirin: Secondary | ICD-10-CM | POA: Diagnosis not present

## 2016-08-17 DIAGNOSIS — J432 Centrilobular emphysema: Secondary | ICD-10-CM | POA: Diagnosis not present

## 2016-08-17 DIAGNOSIS — Z510 Encounter for antineoplastic radiation therapy: Secondary | ICD-10-CM | POA: Diagnosis present

## 2016-08-18 ENCOUNTER — Ambulatory Visit: Payer: No Typology Code available for payment source | Admitting: Internal Medicine

## 2016-08-18 ENCOUNTER — Ambulatory Visit
Admission: RE | Admit: 2016-08-18 | Discharge: 2016-08-18 | Disposition: A | Discharge status: Home / Self Care | Payer: Medicare Other | Source: Ambulatory Visit | Attending: Radiation Oncology | Admitting: Radiation Oncology

## 2016-08-18 ENCOUNTER — Inpatient Hospital Stay: Payer: Medicare Other | Attending: Radiation Oncology

## 2016-08-18 ENCOUNTER — Other Ambulatory Visit: Payer: Self-pay

## 2016-08-18 ENCOUNTER — Other Ambulatory Visit: Payer: No Typology Code available for payment source

## 2016-08-18 DIAGNOSIS — Z801 Family history of malignant neoplasm of trachea, bronchus and lung: Secondary | ICD-10-CM | POA: Insufficient documentation

## 2016-08-18 DIAGNOSIS — E119 Type 2 diabetes mellitus without complications: Secondary | ICD-10-CM | POA: Diagnosis not present

## 2016-08-18 DIAGNOSIS — R918 Other nonspecific abnormal finding of lung field: Secondary | ICD-10-CM | POA: Insufficient documentation

## 2016-08-18 DIAGNOSIS — R5383 Other fatigue: Secondary | ICD-10-CM | POA: Insufficient documentation

## 2016-08-18 DIAGNOSIS — K589 Irritable bowel syndrome without diarrhea: Secondary | ICD-10-CM | POA: Diagnosis not present

## 2016-08-18 DIAGNOSIS — E785 Hyperlipidemia, unspecified: Secondary | ICD-10-CM | POA: Diagnosis not present

## 2016-08-18 DIAGNOSIS — Z923 Personal history of irradiation: Secondary | ICD-10-CM | POA: Insufficient documentation

## 2016-08-18 DIAGNOSIS — R21 Rash and other nonspecific skin eruption: Secondary | ICD-10-CM | POA: Diagnosis not present

## 2016-08-18 DIAGNOSIS — N390 Urinary tract infection, site not specified: Secondary | ICD-10-CM | POA: Insufficient documentation

## 2016-08-18 DIAGNOSIS — I700 Atherosclerosis of aorta: Secondary | ICD-10-CM | POA: Diagnosis not present

## 2016-08-18 DIAGNOSIS — M818 Other osteoporosis without current pathological fracture: Secondary | ICD-10-CM | POA: Insufficient documentation

## 2016-08-18 DIAGNOSIS — I499 Cardiac arrhythmia, unspecified: Secondary | ICD-10-CM | POA: Diagnosis not present

## 2016-08-18 DIAGNOSIS — Z7982 Long term (current) use of aspirin: Secondary | ICD-10-CM | POA: Diagnosis not present

## 2016-08-18 DIAGNOSIS — I10 Essential (primary) hypertension: Secondary | ICD-10-CM | POA: Diagnosis not present

## 2016-08-18 DIAGNOSIS — F17210 Nicotine dependence, cigarettes, uncomplicated: Secondary | ICD-10-CM | POA: Diagnosis not present

## 2016-08-18 DIAGNOSIS — D696 Thrombocytopenia, unspecified: Secondary | ICD-10-CM | POA: Diagnosis not present

## 2016-08-18 DIAGNOSIS — Z79899 Other long term (current) drug therapy: Secondary | ICD-10-CM | POA: Diagnosis not present

## 2016-08-18 DIAGNOSIS — M069 Rheumatoid arthritis, unspecified: Secondary | ICD-10-CM | POA: Insufficient documentation

## 2016-08-18 DIAGNOSIS — R05 Cough: Secondary | ICD-10-CM | POA: Insufficient documentation

## 2016-08-18 DIAGNOSIS — D7589 Other specified diseases of blood and blood-forming organs: Secondary | ICD-10-CM | POA: Insufficient documentation

## 2016-08-18 DIAGNOSIS — Z803 Family history of malignant neoplasm of breast: Secondary | ICD-10-CM | POA: Insufficient documentation

## 2016-08-18 DIAGNOSIS — J449 Chronic obstructive pulmonary disease, unspecified: Secondary | ICD-10-CM | POA: Diagnosis not present

## 2016-08-18 DIAGNOSIS — D751 Secondary polycythemia: Secondary | ICD-10-CM | POA: Diagnosis not present

## 2016-08-18 DIAGNOSIS — Z510 Encounter for antineoplastic radiation therapy: Secondary | ICD-10-CM | POA: Diagnosis not present

## 2016-08-18 DIAGNOSIS — Z8781 Personal history of (healed) traumatic fracture: Secondary | ICD-10-CM | POA: Insufficient documentation

## 2016-08-18 LAB — CBC WITH DIFFERENTIAL/PLATELET
BASOS ABS: 0.1 10*3/uL (ref 0–0.1)
BASOS PCT: 1 %
Eosinophils Absolute: 0.3 10*3/uL (ref 0–0.7)
Eosinophils Relative: 5 %
HEMATOCRIT: 43 % (ref 35.0–47.0)
Hemoglobin: 15 g/dL (ref 12.0–16.0)
Lymphocytes Relative: 17 %
Lymphs Abs: 1 10*3/uL (ref 1.0–3.6)
MCH: 37.9 pg — ABNORMAL HIGH (ref 26.0–34.0)
MCHC: 34.8 g/dL (ref 32.0–36.0)
MCV: 108.8 fL — ABNORMAL HIGH (ref 80.0–100.0)
MONO ABS: 0.6 10*3/uL (ref 0.2–0.9)
Monocytes Relative: 10 %
NEUTROS ABS: 3.9 10*3/uL (ref 1.4–6.5)
NEUTROS PCT: 67 %
PLATELETS: 113 10*3/uL — AB (ref 150–440)
RBC: 3.95 MIL/uL (ref 3.80–5.20)
RDW: 13.6 % (ref 11.5–14.5)
WBC: 5.8 10*3/uL (ref 3.6–11.0)

## 2016-08-18 LAB — BASIC METABOLIC PANEL
ANION GAP: 9 (ref 5–15)
BUN: 6 mg/dL (ref 6–20)
CALCIUM: 9.5 mg/dL (ref 8.9–10.3)
CO2: 31 mmol/L (ref 22–32)
Chloride: 92 mmol/L — ABNORMAL LOW (ref 101–111)
Creatinine, Ser: 0.73 mg/dL (ref 0.44–1.00)
Glucose, Bld: 100 mg/dL — ABNORMAL HIGH (ref 65–99)
Potassium: 4.1 mmol/L (ref 3.5–5.1)
Sodium: 132 mmol/L — ABNORMAL LOW (ref 135–145)

## 2016-08-19 ENCOUNTER — Ambulatory Visit
Admission: RE | Admit: 2016-08-19 | Discharge: 2016-08-19 | Disposition: A | Discharge status: Home / Self Care | Payer: Medicare Other | Source: Ambulatory Visit | Attending: Radiation Oncology | Admitting: Radiation Oncology

## 2016-08-19 DIAGNOSIS — Z510 Encounter for antineoplastic radiation therapy: Secondary | ICD-10-CM | POA: Diagnosis not present

## 2016-08-20 ENCOUNTER — Ambulatory Visit: Payer: Medicare Other

## 2016-08-23 ENCOUNTER — Ambulatory Visit
Admission: RE | Admit: 2016-08-23 | Discharge: 2016-08-23 | Disposition: A | Discharge status: Home / Self Care | Payer: Medicare Other | Source: Ambulatory Visit | Attending: Radiation Oncology | Admitting: Radiation Oncology

## 2016-08-23 DIAGNOSIS — Z510 Encounter for antineoplastic radiation therapy: Secondary | ICD-10-CM | POA: Diagnosis not present

## 2016-08-24 ENCOUNTER — Ambulatory Visit
Admission: RE | Admit: 2016-08-24 | Discharge: 2016-08-24 | Disposition: A | Discharge status: Home / Self Care | Payer: Medicare Other | Source: Ambulatory Visit | Attending: Radiation Oncology | Admitting: Radiation Oncology

## 2016-08-24 DIAGNOSIS — Z510 Encounter for antineoplastic radiation therapy: Secondary | ICD-10-CM | POA: Diagnosis not present

## 2016-08-25 ENCOUNTER — Encounter: Payer: Self-pay | Admitting: Hematology and Oncology

## 2016-08-25 ENCOUNTER — Inpatient Hospital Stay: Payer: Medicare Other

## 2016-08-25 ENCOUNTER — Ambulatory Visit
Admission: RE | Admit: 2016-08-25 | Discharge: 2016-08-25 | Disposition: A | Discharge status: Home / Self Care | Payer: Medicare Other | Source: Ambulatory Visit | Attending: Radiation Oncology | Admitting: Radiation Oncology

## 2016-08-25 ENCOUNTER — Inpatient Hospital Stay (HOSPITAL_BASED_OUTPATIENT_CLINIC_OR_DEPARTMENT_OTHER): Payer: Medicare Other | Admitting: Hematology and Oncology

## 2016-08-25 VITALS — BP 134/82 | HR 90 | Temp 97.6°F | Resp 20 | Ht 63.0 in | Wt 140.4 lb

## 2016-08-25 DIAGNOSIS — M069 Rheumatoid arthritis, unspecified: Secondary | ICD-10-CM

## 2016-08-25 DIAGNOSIS — I499 Cardiac arrhythmia, unspecified: Secondary | ICD-10-CM

## 2016-08-25 DIAGNOSIS — Z79899 Other long term (current) drug therapy: Secondary | ICD-10-CM

## 2016-08-25 DIAGNOSIS — Z510 Encounter for antineoplastic radiation therapy: Secondary | ICD-10-CM | POA: Diagnosis not present

## 2016-08-25 DIAGNOSIS — Z803 Family history of malignant neoplasm of breast: Secondary | ICD-10-CM

## 2016-08-25 DIAGNOSIS — D696 Thrombocytopenia, unspecified: Secondary | ICD-10-CM | POA: Insufficient documentation

## 2016-08-25 DIAGNOSIS — I700 Atherosclerosis of aorta: Secondary | ICD-10-CM

## 2016-08-25 DIAGNOSIS — I10 Essential (primary) hypertension: Secondary | ICD-10-CM

## 2016-08-25 DIAGNOSIS — F17210 Nicotine dependence, cigarettes, uncomplicated: Secondary | ICD-10-CM

## 2016-08-25 DIAGNOSIS — C3412 Malignant neoplasm of upper lobe, left bronchus or lung: Secondary | ICD-10-CM

## 2016-08-25 DIAGNOSIS — R05 Cough: Secondary | ICD-10-CM

## 2016-08-25 DIAGNOSIS — D7589 Other specified diseases of blood and blood-forming organs: Secondary | ICD-10-CM | POA: Diagnosis not present

## 2016-08-25 DIAGNOSIS — R918 Other nonspecific abnormal finding of lung field: Secondary | ICD-10-CM

## 2016-08-25 DIAGNOSIS — M818 Other osteoporosis without current pathological fracture: Secondary | ICD-10-CM

## 2016-08-25 DIAGNOSIS — D751 Secondary polycythemia: Secondary | ICD-10-CM | POA: Diagnosis not present

## 2016-08-25 DIAGNOSIS — Z720 Tobacco use: Secondary | ICD-10-CM

## 2016-08-25 DIAGNOSIS — N390 Urinary tract infection, site not specified: Secondary | ICD-10-CM

## 2016-08-25 DIAGNOSIS — J449 Chronic obstructive pulmonary disease, unspecified: Secondary | ICD-10-CM

## 2016-08-25 DIAGNOSIS — K589 Irritable bowel syndrome without diarrhea: Secondary | ICD-10-CM

## 2016-08-25 DIAGNOSIS — Z801 Family history of malignant neoplasm of trachea, bronchus and lung: Secondary | ICD-10-CM

## 2016-08-25 DIAGNOSIS — R5383 Other fatigue: Secondary | ICD-10-CM

## 2016-08-25 DIAGNOSIS — Z923 Personal history of irradiation: Secondary | ICD-10-CM

## 2016-08-25 DIAGNOSIS — E785 Hyperlipidemia, unspecified: Secondary | ICD-10-CM

## 2016-08-25 DIAGNOSIS — R21 Rash and other nonspecific skin eruption: Secondary | ICD-10-CM

## 2016-08-25 DIAGNOSIS — Z8781 Personal history of (healed) traumatic fracture: Secondary | ICD-10-CM

## 2016-08-25 DIAGNOSIS — E119 Type 2 diabetes mellitus without complications: Secondary | ICD-10-CM

## 2016-08-25 DIAGNOSIS — Z7982 Long term (current) use of aspirin: Secondary | ICD-10-CM

## 2016-08-25 LAB — CBC
HCT: 42.4 % (ref 35.0–47.0)
Hemoglobin: 14.9 g/dL (ref 12.0–16.0)
MCH: 38.2 pg — AB (ref 26.0–34.0)
MCHC: 35.1 g/dL (ref 32.0–36.0)
MCV: 108.9 fL — AB (ref 80.0–100.0)
PLATELETS: 106 10*3/uL — AB (ref 150–440)
RBC: 3.9 MIL/uL (ref 3.80–5.20)
RDW: 13.9 % (ref 11.5–14.5)
WBC: 6.7 10*3/uL (ref 3.6–11.0)

## 2016-08-25 NOTE — Assessment & Plan Note (Signed)
She is tolerated radiation therapy well Continue the same I will schedule return visit with her primary oncologist in 1 month

## 2016-08-25 NOTE — Assessment & Plan Note (Signed)
I spent some time counseling the patient the importance of tobacco cessation. We discussed common strategies including nicotine patches, Tobacco Quit-line, and other nicotine replacement products to assist in hereffort to quit  she appears motivated to quit and is slowly quitting on her own

## 2016-08-25 NOTE — Progress Notes (Signed)
[place] FOLLOW-UP progress notes  Patient Care Team: Adin Hector, MD as PCP - General (Internal Medicine)  CHIEF COMPLAINTS/PURPOSE OF VISIT:  Lung cancer, undergoing palliative radiation treatment  HISTORY OF PRESENTING ILLNESS:  Madeline Schmitt 71 y.o. female was transferred to my care as her oncologist is not available I reviewed the patient's records extensive and collaborated the history with the patient. Summary of her history is as follows: Oncology History   # NOV 2017- LUL MASS PET- SUV 12; Pt DECLINED BIOPSY. DEC 1st 2017- RT [Dr.Crystal]  # ERYTHROCYTOSIS- likely sec to smoking [Jak-2/exon 12- NEG]  # Hx of smoking- does not want to quit.      Cancer of upper lobe of left lung ([place])   07/07/2016 Initial Diagnosis    Cancer of upper lobe of left lung [facility])     She is currently undergoing daily XRT for lung cancer.  Biopsy is not available She has some skin irritation on her back, some mild cough and fatigue but doing well so far Denies esophagitis Denies recent nausea, fever or chills She was found to have recent bladder infection, on antibiotics treatment She denies current dysuria, frequency and urgency She is attempting to quit smoking  MEDICAL HISTORY:  Past Medical History:  Diagnosis Date  . Aortic atherosclerosis ([place])   . Arrhythmia   . COPD (chronic obstructive pulmonary disease) ([place])   . Diabetes mellitus type 2, uncomplicated ([place])   . Essential hypertension   . History of pelvic fracture   . Hyperlipidemia, unspecified   . IBS (irritable bowel syndrome)   . Lung cancer ([place])   . Lung mass   . Osteoporosis   . Rheumatoid arthritis involving multiple sites with positive rheumatoid factor ([place])   . Secondary polycythemia     SURGICAL HISTORY: Past Surgical History:  Procedure Laterality Date  . BREAST BIOPSY Bilateral 1970   negative  . COLONOSCOPY     03/30/2002, 12/25/2004, 03/17/2007, 05/08/2010  . Resection of  chronic olecranon bursitis    . S/P oral surgery      SOCIAL HISTORY: Social History   Social History  . Marital status: Divorced    Spouse name: N/A  . Number of children: N/A  . Years of education: N/A   Occupational History  . Not on file.   Social History Main Topics  . Smoking status: Current Some Day Smoker    Packs/day: 0.50    Years: 55.00  . Smokeless tobacco: Never Used  . Alcohol use 8.4 oz/week    14 Glasses of wine per week     Comment: 1-3 glasses of wine a day  . Drug use: No  . Sexual activity: Not on file   Other Topics Concern  . Not on file   Social History Narrative  . No narrative on file    FAMILY HISTORY: Family History  Problem Relation Age of Onset  . Lung cancer Brother   . Bone cancer Brother   . Breast cancer Neg Hx     ALLERGIES:  is allergic to augmentin [amoxicillin-pot clavulanate]; penicillins; ace inhibitors; actonel [risedronate sodium]; celexa [citalopram hydrobromide]; ciprofloxacin; dicyclomine hcl; maxzide [triamterene-hctz]; nicoderm [nicotine]; nsaids; oxycodone; and zyban [bupropion].  MEDICATIONS:  Current Outpatient Prescriptions  Medication Sig Dispense Refill  . albuterol (PROAIR HFA) 108 (90 Base) MCG/ACT inhaler Inhale 2 puffs into the lungs every 6 (six) hours as needed for wheezing or shortness of breath.    Marland Kitchen amLODipine (NORVASC) 2.5  MG tablet Take 2.5 mg by mouth daily.    Marland Kitchen aspirin 81 MG tablet Take 81 mg by mouth daily.    Marland Kitchen atenolol (TENORMIN) 100 MG tablet   0  . Cholecalciferol 1000 units tablet Take 1,000 Units by mouth daily.    . clonazePAM (KLONOPIN) 0.5 MG tablet Take 0.5 mg by mouth 2 (two) times daily as needed for anxiety.    . fluticasone (FLONASE) 50 MCG/ACT nasal spray Place into both nostrils daily as needed.     . Fluticasone-Salmeterol (ADVAIR) 250-50 MCG/DOSE AEPB Inhale 1 puff into the lungs 2 (two) times daily.    . hydrocortisone cream 1 % Apply 1 application topically 2 (two) times  daily. To affected area from lung radiation    . omeprazole (PRILOSEC) 20 MG capsule Take 20 mg by mouth daily.    . QUEtiapine (SEROQUEL) 25 MG tablet Take 25 mg by mouth at bedtime.  0  . sulfamethoxazole-trimethoprim (BACTRIM DS,SEPTRA DS) 800-160 MG tablet Take 1 tablet by mouth 2 (two) times daily.     No current facility-administered medications for this visit.     REVIEW OF SYSTEMS:   Constitutional: Denies fevers, chills or abnormal night sweats Eyes: Denies blurriness of vision, double vision or watery eyes Ears, nose, mouth, throat, and face: Denies mucositis or sore throat Cardiovascular: Denies palpitation, chest discomfort or lower extremity swelling Gastrointestinal:  Denies nausea, heartburn or change in bowel habits Lymphatics: Denies new lymphadenopathy or easy bruising Neurological:Denies numbness, tingling or new weaknesses Behavioral/Psych: Mood is stable, no new changes  All other systems were reviewed with the patient and are negative.  PHYSICAL EXAMINATION: ECOG PERFORMANCE STATUS: 1 - Symptomatic but completely ambulatory  Vitals:   08/25/16 1433  BP: 134/82  Pulse: 90  Resp: 20  Temp: 97.6 F (36.4 C)   Filed Weights   08/25/16 1433  Weight: 140 lb 6.9 oz (63.7 kg)    GENERAL:alert, no distress and comfortable SKIN: skin color, texture, turgor are normal, no rashes or significant lesions EYES: normal, conjunctiva are pink and non-injected, sclera clear OROPHARYNX:no exudate, normal lips, buccal mucosa, and tongue  NECK: supple, thyroid normal size, non-tender, without nodularity LYMPH:  no palpable lymphadenopathy in the cervical, axillary or inguinal LUNGS: clear to auscultation and percussion with normal breathing effort HEART: regular rate & rhythm and no murmurs without lower extremity edema ABDOMEN:abdomen soft, non-tender and normal bowel sounds Musculoskeletal:no cyanosis of digits and no clubbing  PSYCH: alert & oriented x 3 with fluent  speech NEURO: no focal motor/sensory deficits  LABORATORY DATA:  I have reviewed the data as listed Lab Results  Component Value Date   WBC 6.7 08/25/2016   HGB 14.9 08/25/2016   HCT 42.4 08/25/2016   MCV 108.9 (H) 08/25/2016   PLT 106 (L) 08/25/2016    Recent Labs  08/18/16 1530  NA 132*  K 4.1  CL 92*  CO2 31  GLUCOSE 100*  BUN 6  CREATININE 0.73  CALCIUM 9.5  GFRNONAA >60  GFRAA >60    RADIOGRAPHIC STUDIES:I reviewed her last CT scan & PET scan I have personally reviewed the radiological images as listed and agreed with the findings in the report.   ASSESSMENT & PLAN:  Cancer of upper lobe of left lung ([place]) She is tolerated radiation therapy well Continue the same I will schedule return visit with her primary oncologist in 1 month  Macrocytosis without anemia Could be due to alcoholism versus Vitamin B 12 deficiency On review  of her imaging studies, liver appeared cirrhotic I will order labs to check serum chemistry and B12 next week I will call the patient with results  Thrombocytopenia ([place]) The cause could be related to her underlying medical condition/alcoholism. It is mild and there is little change compared from previous platelet count. The patient denies recent history of bleeding such as epistaxis, hematuria or hematochezia. She is asymptomatic from the thrombocytopenia. I will observe for now. Will order further work-up as above  Tobacco abuse I spent some time counseling the patient the importance of tobacco cessation. We discussed common strategies including nicotine patches, Tobacco Quit-line, and other nicotine replacement products to assist in hereffort to quit  she appears motivated to quit and is slowly quitting on her own   UTI (urinary tract infection) She is doing well on oral antibiotics Continue the same   Orders Placed This Encounter  Procedures  . CBC with Differential/Platelet    Standing Status:   Future    Standing  Expiration Date:   09/29/2017  . Comprehensive metabolic panel    Standing Status:   Future    Standing Expiration Date:   09/29/2017  . Vitamin B12    Standing Status:   Future    Standing Expiration Date:   09/29/2017    All questions were answered. The patient knows to call the clinic with any problems, questions or concerns. I spent 20 minutes counseling the patient face to face. The total time spent in the appointment was 25 minutes and more than 50% was on counseling.     Heath Lark, MD 08/25/2016 3:00 PM

## 2016-08-25 NOTE — Assessment & Plan Note (Addendum)
Could be due to alcoholism versus Vitamin B 12 deficiency On review of her imaging studies, liver appeared cirrhotic I will order labs to check serum chemistry and B12 next week I will call the patient with results

## 2016-08-25 NOTE — Assessment & Plan Note (Signed)
The cause could be related to her underlying medical condition/alcoholism. It is mild and there is little change compared from previous platelet count. The patient denies recent history of bleeding such as epistaxis, hematuria or hematochezia. She is asymptomatic from the thrombocytopenia. I will observe for now. Will order further work-up as above

## 2016-08-25 NOTE — Assessment & Plan Note (Signed)
She is doing well on oral antibiotics Continue the same

## 2016-08-26 ENCOUNTER — Ambulatory Visit
Admission: RE | Admit: 2016-08-26 | Discharge: 2016-08-26 | Disposition: A | Discharge status: Home / Self Care | Payer: Medicare Other | Source: Ambulatory Visit | Attending: Radiation Oncology | Admitting: Radiation Oncology

## 2016-08-26 DIAGNOSIS — Z510 Encounter for antineoplastic radiation therapy: Secondary | ICD-10-CM | POA: Diagnosis not present

## 2016-08-27 ENCOUNTER — Ambulatory Visit
Admission: RE | Admit: 2016-08-27 | Discharge: 2016-08-27 | Disposition: A | Discharge status: Home / Self Care | Payer: Medicare Other | Source: Ambulatory Visit | Attending: Radiation Oncology | Admitting: Radiation Oncology

## 2016-08-27 DIAGNOSIS — Z510 Encounter for antineoplastic radiation therapy: Secondary | ICD-10-CM | POA: Diagnosis not present

## 2016-08-30 ENCOUNTER — Ambulatory Visit
Admission: RE | Admit: 2016-08-30 | Discharge: 2016-08-30 | Disposition: A | Discharge status: Home / Self Care | Payer: Medicare Other | Source: Ambulatory Visit | Attending: Radiation Oncology | Admitting: Radiation Oncology

## 2016-08-30 DIAGNOSIS — Z510 Encounter for antineoplastic radiation therapy: Secondary | ICD-10-CM | POA: Diagnosis not present

## 2016-08-31 ENCOUNTER — Ambulatory Visit
Admission: RE | Admit: 2016-08-31 | Discharge: 2016-08-31 | Disposition: A | Discharge status: Home / Self Care | Payer: Medicare Other | Source: Ambulatory Visit | Attending: Radiation Oncology | Admitting: Radiation Oncology

## 2016-08-31 DIAGNOSIS — Z510 Encounter for antineoplastic radiation therapy: Secondary | ICD-10-CM | POA: Diagnosis not present

## 2016-09-01 ENCOUNTER — Ambulatory Visit: Payer: Medicare Other

## 2016-09-01 ENCOUNTER — Inpatient Hospital Stay: Payer: Medicare Other

## 2016-09-02 ENCOUNTER — Ambulatory Visit: Payer: Medicare Other

## 2016-09-03 ENCOUNTER — Ambulatory Visit
Admission: RE | Admit: 2016-09-03 | Discharge: 2016-09-03 | Disposition: A | Discharge status: Home / Self Care | Payer: Medicare Other | Source: Ambulatory Visit | Attending: Radiation Oncology | Admitting: Radiation Oncology

## 2016-09-03 DIAGNOSIS — Z510 Encounter for antineoplastic radiation therapy: Secondary | ICD-10-CM | POA: Diagnosis not present

## 2016-09-06 ENCOUNTER — Ambulatory Visit: Payer: Medicare Other

## 2016-09-07 ENCOUNTER — Other Ambulatory Visit: Payer: Self-pay | Admitting: *Deleted

## 2016-09-07 ENCOUNTER — Ambulatory Visit
Admission: RE | Admit: 2016-09-07 | Discharge: 2016-09-07 | Disposition: A | Discharge status: Home / Self Care | Payer: Medicare Other | Source: Ambulatory Visit | Attending: Radiation Oncology | Admitting: Radiation Oncology

## 2016-09-07 DIAGNOSIS — Z510 Encounter for antineoplastic radiation therapy: Secondary | ICD-10-CM | POA: Diagnosis not present

## 2016-09-07 MED ORDER — SILVER SULFADIAZINE 1 % EX CREA: 1.0000 "application " | TOPICAL_CREAM | Freq: Two times a day (BID) | CUTANEOUS | 3 refills | Status: DC

## 2016-09-08 ENCOUNTER — Ambulatory Visit
Admission: RE | Admit: 2016-09-08 | Discharge: 2016-09-08 | Disposition: A | Discharge status: Home / Self Care | Payer: Medicare Other | Source: Ambulatory Visit | Attending: Radiation Oncology | Admitting: Radiation Oncology

## 2016-09-08 ENCOUNTER — Inpatient Hospital Stay: Payer: Medicare Other

## 2016-09-08 ENCOUNTER — Other Ambulatory Visit: Payer: Self-pay | Admitting: Hematology and Oncology

## 2016-09-08 DIAGNOSIS — Z510 Encounter for antineoplastic radiation therapy: Secondary | ICD-10-CM | POA: Diagnosis not present

## 2016-09-08 DIAGNOSIS — D7589 Other specified diseases of blood and blood-forming organs: Secondary | ICD-10-CM

## 2016-09-08 DIAGNOSIS — R918 Other nonspecific abnormal finding of lung field: Secondary | ICD-10-CM | POA: Diagnosis not present

## 2016-09-08 DIAGNOSIS — C3412 Malignant neoplasm of upper lobe, left bronchus or lung: Secondary | ICD-10-CM

## 2016-09-08 DIAGNOSIS — D696 Thrombocytopenia, unspecified: Secondary | ICD-10-CM

## 2016-09-08 LAB — COMPREHENSIVE METABOLIC PANEL
ALBUMIN: 3.8 g/dL (ref 3.5–5.0)
ALK PHOS: 71 U/L (ref 38–126)
ALT: 16 U/L (ref 14–54)
ANION GAP: 10 (ref 5–15)
AST: 24 U/L (ref 15–41)
BUN: 7 mg/dL (ref 6–20)
CALCIUM: 9.2 mg/dL (ref 8.9–10.3)
CO2: 28 mmol/L (ref 22–32)
CREATININE: 0.58 mg/dL (ref 0.44–1.00)
Chloride: 92 mmol/L — ABNORMAL LOW (ref 101–111)
GFR calc Af Amer: >60 mL/min (ref >60)
GFR calc non Af Amer: >60 mL/min (ref >60)
GLUCOSE: 112 mg/dL — AB (ref 65–99)
Potassium: 3.8 mmol/L (ref 3.5–5.1)
Sodium: 130 mmol/L — ABNORMAL LOW (ref 135–145)
Total Bilirubin: 0.5 mg/dL (ref 0.3–1.2)
Total Protein: 7.7 g/dL (ref 6.5–8.1)

## 2016-09-08 LAB — CBC WITH DIFFERENTIAL/PLATELET
BASOS PCT: 1 %
Basophils Absolute: 0 10*3/uL (ref 0–0.1)
Eosinophils Absolute: 0.2 10*3/uL (ref 0–0.7)
Eosinophils Relative: 5 %
HEMATOCRIT: 41.9 % (ref 35.0–47.0)
HEMOGLOBIN: 14.5 g/dL (ref 12.0–16.0)
Lymphocytes Relative: 15 %
Lymphs Abs: 0.6 10*3/uL — ABNORMAL LOW (ref 1.0–3.6)
MCH: 37.8 pg — ABNORMAL HIGH (ref 26.0–34.0)
MCHC: 34.7 g/dL (ref 32.0–36.0)
MCV: 109 fL — ABNORMAL HIGH (ref 80.0–100.0)
MONOS PCT: 10 %
Monocytes Absolute: 0.4 10*3/uL (ref 0.2–0.9)
NEUTROS ABS: 2.9 10*3/uL (ref 1.4–6.5)
NEUTROS PCT: 69 %
Platelets: 108 10*3/uL — ABNORMAL LOW (ref 150–440)
RBC: 3.85 MIL/uL (ref 3.80–5.20)
RDW: 14 % (ref 11.5–14.5)
WBC: 4.3 10*3/uL (ref 3.6–11.0)

## 2016-09-08 LAB — VITAMIN B12: Vitamin B-12: 376 pg/mL (ref 180–914)

## 2016-09-09 ENCOUNTER — Ambulatory Visit: Payer: Medicare Other

## 2016-09-10 ENCOUNTER — Ambulatory Visit: Payer: Medicare Other

## 2016-09-13 ENCOUNTER — Ambulatory Visit: Payer: Medicare Other

## 2016-09-13 DIAGNOSIS — Z510 Encounter for antineoplastic radiation therapy: Secondary | ICD-10-CM | POA: Diagnosis not present

## 2016-09-14 ENCOUNTER — Ambulatory Visit
Admission: RE | Admit: 2016-09-14 | Discharge: 2016-09-14 | Disposition: A | Discharge status: Home / Self Care | Payer: Medicare Other | Source: Ambulatory Visit | Attending: Radiation Oncology | Admitting: Radiation Oncology

## 2016-09-14 DIAGNOSIS — Z510 Encounter for antineoplastic radiation therapy: Secondary | ICD-10-CM | POA: Diagnosis not present

## 2016-09-15 ENCOUNTER — Ambulatory Visit
Admission: RE | Admit: 2016-09-15 | Discharge: 2016-09-15 | Disposition: A | Discharge status: Home / Self Care | Payer: Medicare Other | Source: Ambulatory Visit | Attending: Radiation Oncology | Admitting: Radiation Oncology

## 2016-09-15 DIAGNOSIS — Z510 Encounter for antineoplastic radiation therapy: Secondary | ICD-10-CM | POA: Diagnosis not present

## 2016-09-16 ENCOUNTER — Ambulatory Visit
Admission: RE | Admit: 2016-09-16 | Discharge: 2016-09-16 | Disposition: A | Discharge status: Home / Self Care | Payer: Medicare Other | Source: Ambulatory Visit | Attending: Radiation Oncology | Admitting: Radiation Oncology

## 2016-09-17 ENCOUNTER — Ambulatory Visit: Payer: Medicare Other

## 2016-09-17 ENCOUNTER — Ambulatory Visit
Admission: RE | Admit: 2016-09-17 | Discharge: 2016-09-17 | Disposition: A | Discharge status: Home / Self Care | Payer: Medicare Other | Source: Ambulatory Visit | Attending: Radiation Oncology | Admitting: Radiation Oncology

## 2016-09-17 DIAGNOSIS — Z510 Encounter for antineoplastic radiation therapy: Secondary | ICD-10-CM | POA: Diagnosis not present

## 2016-09-20 ENCOUNTER — Ambulatory Visit: Payer: Medicare Other

## 2016-09-20 ENCOUNTER — Ambulatory Visit
Admission: RE | Admit: 2016-09-20 | Discharge: 2016-09-20 | Disposition: A | Discharge status: Home / Self Care | Payer: Medicare Other | Source: Ambulatory Visit | Attending: Radiation Oncology | Admitting: Radiation Oncology

## 2016-09-20 DIAGNOSIS — Z510 Encounter for antineoplastic radiation therapy: Secondary | ICD-10-CM | POA: Diagnosis not present

## 2016-09-21 ENCOUNTER — Other Ambulatory Visit: Payer: Self-pay | Admitting: *Deleted

## 2016-09-21 ENCOUNTER — Ambulatory Visit
Admission: RE | Admit: 2016-09-21 | Discharge: 2016-09-21 | Disposition: A | Discharge status: Home / Self Care | Payer: Medicare Other | Source: Ambulatory Visit | Attending: Radiation Oncology | Admitting: Radiation Oncology

## 2016-09-21 DIAGNOSIS — Z510 Encounter for antineoplastic radiation therapy: Secondary | ICD-10-CM | POA: Diagnosis not present

## 2016-09-21 DIAGNOSIS — C3412 Malignant neoplasm of upper lobe, left bronchus or lung: Secondary | ICD-10-CM

## 2016-09-22 ENCOUNTER — Inpatient Hospital Stay: Payer: Medicare Other | Attending: Internal Medicine | Admitting: Internal Medicine

## 2016-09-22 ENCOUNTER — Other Ambulatory Visit: Payer: Self-pay | Admitting: *Deleted

## 2016-09-22 ENCOUNTER — Ambulatory Visit: Payer: Medicare Other

## 2016-09-22 ENCOUNTER — Ambulatory Visit
Admission: RE | Admit: 2016-09-22 | Discharge: 2016-09-22 | Disposition: A | Discharge status: Home / Self Care | Payer: Medicare Other | Source: Ambulatory Visit | Attending: Radiation Oncology | Admitting: Radiation Oncology

## 2016-09-22 ENCOUNTER — Inpatient Hospital Stay: Payer: Medicare Other

## 2016-09-22 VITALS — BP 135/79 | HR 93 | Temp 97.8°F | Wt 138.1 lb

## 2016-09-22 DIAGNOSIS — Z803 Family history of malignant neoplasm of breast: Secondary | ICD-10-CM | POA: Insufficient documentation

## 2016-09-22 DIAGNOSIS — I10 Essential (primary) hypertension: Secondary | ICD-10-CM

## 2016-09-22 DIAGNOSIS — Z801 Family history of malignant neoplasm of trachea, bronchus and lung: Secondary | ICD-10-CM | POA: Diagnosis not present

## 2016-09-22 DIAGNOSIS — K589 Irritable bowel syndrome without diarrhea: Secondary | ICD-10-CM | POA: Diagnosis not present

## 2016-09-22 DIAGNOSIS — M818 Other osteoporosis without current pathological fracture: Secondary | ICD-10-CM | POA: Insufficient documentation

## 2016-09-22 DIAGNOSIS — J449 Chronic obstructive pulmonary disease, unspecified: Secondary | ICD-10-CM | POA: Diagnosis not present

## 2016-09-22 DIAGNOSIS — Z8781 Personal history of (healed) traumatic fracture: Secondary | ICD-10-CM

## 2016-09-22 DIAGNOSIS — D751 Secondary polycythemia: Secondary | ICD-10-CM | POA: Diagnosis not present

## 2016-09-22 DIAGNOSIS — Z79899 Other long term (current) drug therapy: Secondary | ICD-10-CM | POA: Diagnosis not present

## 2016-09-22 DIAGNOSIS — Z510 Encounter for antineoplastic radiation therapy: Secondary | ICD-10-CM | POA: Diagnosis not present

## 2016-09-22 DIAGNOSIS — C3412 Malignant neoplasm of upper lobe, left bronchus or lung: Secondary | ICD-10-CM | POA: Diagnosis present

## 2016-09-22 DIAGNOSIS — I2510 Atherosclerotic heart disease of native coronary artery without angina pectoris: Secondary | ICD-10-CM | POA: Insufficient documentation

## 2016-09-22 DIAGNOSIS — E119 Type 2 diabetes mellitus without complications: Secondary | ICD-10-CM

## 2016-09-22 DIAGNOSIS — M069 Rheumatoid arthritis, unspecified: Secondary | ICD-10-CM

## 2016-09-22 DIAGNOSIS — D7589 Other specified diseases of blood and blood-forming organs: Secondary | ICD-10-CM | POA: Diagnosis not present

## 2016-09-22 DIAGNOSIS — F17210 Nicotine dependence, cigarettes, uncomplicated: Secondary | ICD-10-CM

## 2016-09-22 DIAGNOSIS — Z7982 Long term (current) use of aspirin: Secondary | ICD-10-CM | POA: Diagnosis not present

## 2016-09-22 DIAGNOSIS — E785 Hyperlipidemia, unspecified: Secondary | ICD-10-CM

## 2016-09-22 DIAGNOSIS — L598 Other specified disorders of the skin and subcutaneous tissue related to radiation: Secondary | ICD-10-CM | POA: Diagnosis not present

## 2016-09-22 LAB — CBC WITH DIFFERENTIAL/PLATELET
Basophils Absolute: 0 10*3/uL (ref 0–0.1)
Basophils Relative: 1 %
Eosinophils Absolute: 0.2 10*3/uL (ref 0–0.7)
Eosinophils Relative: 3 %
HEMATOCRIT: 41.1 % (ref 35.0–47.0)
Hemoglobin: 14.4 g/dL (ref 12.0–16.0)
LYMPHS PCT: 12 %
Lymphs Abs: 0.6 10*3/uL — ABNORMAL LOW (ref 1.0–3.6)
MCH: 38.3 pg — ABNORMAL HIGH (ref 26.0–34.0)
MCHC: 35 g/dL (ref 32.0–36.0)
MCV: 109.5 fL — AB (ref 80.0–100.0)
MONO ABS: 0.5 10*3/uL (ref 0.2–0.9)
MONOS PCT: 10 %
NEUTROS ABS: 4 10*3/uL (ref 1.4–6.5)
Neutrophils Relative %: 74 %
Platelets: 124 10*3/uL — ABNORMAL LOW (ref 150–440)
RBC: 3.75 MIL/uL — ABNORMAL LOW (ref 3.80–5.20)
RDW: 14.2 % (ref 11.5–14.5)
WBC: 5.3 10*3/uL (ref 3.6–11.0)

## 2016-09-22 LAB — COMPREHENSIVE METABOLIC PANEL
ALK PHOS: 66 U/L (ref 38–126)
ALT: 12 U/L — ABNORMAL LOW (ref 14–54)
ANION GAP: 9 (ref 5–15)
AST: 23 U/L (ref 15–41)
Albumin: 3.8 g/dL (ref 3.5–5.0)
BILIRUBIN TOTAL: 0.5 mg/dL (ref 0.3–1.2)
BUN: 8 mg/dL (ref 6–20)
CALCIUM: 9.1 mg/dL (ref 8.9–10.3)
CO2: 28 mmol/L (ref 22–32)
Chloride: 93 mmol/L — ABNORMAL LOW (ref 101–111)
Creatinine, Ser: 0.63 mg/dL (ref 0.44–1.00)
GFR calc Af Amer: >60 mL/min (ref >60)
Glucose, Bld: 158 mg/dL — ABNORMAL HIGH (ref 65–99)
POTASSIUM: 4 mmol/L (ref 3.5–5.1)
Sodium: 130 mmol/L — ABNORMAL LOW (ref 135–145)
TOTAL PROTEIN: 7.7 g/dL (ref 6.5–8.1)

## 2016-09-22 NOTE — Assessment & Plan Note (Addendum)
#   LUL mass- highly suspicious for malignancy. patient declined biopsy- currently on RT. Doing well except for radiation dermatitis.  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   # COPD- continue proair q 4-6 hours; continue advair. Stable.   # Erythrocytosis sec to smoking/COPD- improved.  # macrocytosis- ? Alcohol; b12 normal.   # Follow up with me in 6 weeks/ cbc-bmp. Will order CT scan at that visit.

## 2016-09-22 NOTE — Progress Notes (Signed)
Patient here today for follow up.  Patient states no new concerns today  

## 2016-09-22 NOTE — Progress Notes (Signed)
[place] OFFICE PROGRESS NOTE  Patient Care Team: Adin Hector, MD as PCP - General (Internal Medicine)   SUMMARY OF ONCOLOGIC HISTORY:  Oncology History   # NOV 2017- LUL MASS PET- SUV 12; Pt DECLINED BIOPSY. DEC 1st 2017- RT [Dr.Crystal]  # ERYTHROCYTOSIS- likely sec to smoking [Jak-2/exon 12- NEG]  # Hx of smoking- does not want to quit.      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   Patient complains of skin rash at the site of radiation; it was held for a few days and then restarted recently. Patient is currently using Silvadene cream. Itching improved. She denies any unusual shortness of breath or cough. Denies any leg swelling. Unfortunately she continues to smoke. Drinks wine everyday.   REVIEW OF SYSTEMS:  A complete 10 point review of system is done which is negative except mentioned above/history of present illness.   PAST MEDICAL HISTORY :  Past Medical History:  Diagnosis Date  . Aortic atherosclerosis ([place])   . Arrhythmia   . COPD (chronic obstructive pulmonary disease) ([place])   . Diabetes mellitus type 2, uncomplicated ([place])   . Essential hypertension   . History of pelvic fracture   . Hyperlipidemia, unspecified   . IBS (irritable bowel syndrome)   . Lung cancer ([place])   . Lung mass   . Osteoporosis   . Rheumatoid arthritis involving multiple sites with positive rheumatoid factor ([place])   . Secondary polycythemia   ; HTN; COPD  PAST SURGICAL HISTORY :   Past Surgical History:  Procedure Laterality Date  . BREAST BIOPSY Bilateral 1970   negative  . COLONOSCOPY     03/30/2002, 12/25/2004, 03/17/2007, 05/08/2010  . Resection of chronic olecranon bursitis    . S/P oral  surgery      FAMILY HISTORY :   Family History  Problem Relation Age of Onset  . Lung cancer Brother   . Bone cancer Brother   . Breast cancer Neg Hx     SOCIAL HISTORY:  History of smoking; denies alcohol abuse.  Social History  Substance Use Topics  . Smoking status: Current Some Day Smoker    Packs/day: 0.50    Years: 55.00  . Smokeless tobacco: Never Used  . Alcohol use 8.4 oz/week    14 Glasses of wine per week     Comment: 1-3 glasses of wine a day    ALLERGIES:  is allergic to augmentin [amoxicillin-pot clavulanate]; penicillins; ace inhibitors; actonel [risedronate sodium]; celexa [citalopram hydrobromide]; ciprofloxacin; dicyclomine hcl; maxzide [triamterene-hctz]; nicoderm [nicotine]; nsaids; oxycodone; and zyban [bupropion].  MEDICATIONS:  Current Outpatient Prescriptions  Medication Sig Dispense Refill  . albuterol (PROAIR HFA) 108 (90 Base) MCG/ACT inhaler Inhale 2 puffs into the lungs every 6 (six) hours as needed for wheezing or shortness of breath.    Marland Kitchen amLODipine (NORVASC) 2.5 MG tablet Take 2.5 mg by mouth daily.    Marland Kitchen aspirin 81 MG tablet Take 81 mg by mouth daily.    Marland Kitchen atenolol (TENORMIN) 100 MG tablet Take 100 mg by mouth daily.   0  . Cholecalciferol 1000 units tablet Take 1,000 Units by mouth daily.    . clonazePAM (KLONOPIN) 0.5 MG  tablet Take 0.5 mg by mouth 2 (two) times daily as needed for anxiety.    . fluticasone (FLONASE) 50 MCG/ACT nasal spray Place into both nostrils daily as needed.     . Fluticasone-Salmeterol (ADVAIR) 250-50 MCG/DOSE AEPB Inhale 1 puff into the lungs 2 (two) times daily.    . hydrocortisone cream 1 % Apply 1 application topically 2 (two) times daily. To affected area from lung radiation    . omeprazole (PRILOSEC) 20 MG capsule Take 20 mg by mouth daily.    . QUEtiapine (SEROQUEL) 25 MG tablet Take 25 mg by mouth at bedtime.  0  . silver sulfADIAZINE (SILVADENE) 1 % cream Apply 1 application topically 2 (two) times daily. 50 g 3    No current facility-administered medications for this visit.     PHYSICAL EXAMINATION:   BP 135/79 (BP Location: Left Arm, Patient Position: Sitting)   Pulse 93   Temp 97.8 F (36.6 C) (Tympanic)   Wt 138 lb 2 oz (62.7 kg)   BMI 24.47 kg/m   Filed Weights   09/22/16 1440  Weight: 138 lb 2 oz (62.7 kg)    GENERAL: Well-nourished well-developed; Alert, no distress and comfortable.  Alone. EYES: no pallor or icterus OROPHARYNX: no thrush or ulceration; dentures.  NECK: supple, no masses felt LYMPH:  no palpable lymphadenopathy in the cervical, axillary or inguinal regions LUNGS: Decreased breath sounds to auscultation and  Scattered wheezing HEART/CVS: regular rate & rhythm and no murmurs; No lower extremity edema ABDOMEN:abdomen soft, non-tender and normal bowel sounds Musculoskeletal:no cyanosis of digits and no clubbing  PSYCH: alert & oriented x 3 with fluent speech NEURO: no focal motor/sensory deficits SKIN:  Rash noted in radiation portal.   LABORATORY DATA:  I have reviewed the data as listed    Component Value Date/Time   NA 130 (L) 09/22/2016 1405   K 4.0 09/22/2016 1405   CL 93 (L) 09/22/2016 1405   CO2 28 09/22/2016 1405   GLUCOSE 158 (H) 09/22/2016 1405   BUN 8 09/22/2016 1405   CREATININE 0.63 09/22/2016 1405   CREATININE 0.77 01/21/2014 1031   CALCIUM 9.1 09/22/2016 1405   PROT 7.7 09/22/2016 1405   PROT 8.4 (H) 01/21/2014 1031   ALBUMIN 3.8 09/22/2016 1405   ALBUMIN 3.8 01/21/2014 1031   AST 23 09/22/2016 1405   AST 17 01/21/2014 1031   ALT 12 (L) 09/22/2016 1405   ALT 18 01/21/2014 1031   ALKPHOS 66 09/22/2016 1405   ALKPHOS 82 01/21/2014 1031   BILITOT 0.5 09/22/2016 1405   BILITOT 0.4 01/21/2014 1031   GFRNONAA >60 09/22/2016 1405   GFRNONAA >60 01/21/2014 1031   GFRAA >60 09/22/2016 1405   GFRAA >60 01/21/2014 1031    No results found for: SPEP, UPEP  Lab Results  Component Value Date   WBC 5.3 09/22/2016   NEUTROABS 4.0  09/22/2016   HGB 14.4 09/22/2016   HCT 41.1 09/22/2016   MCV 109.5 (H) 09/22/2016   PLT 124 (L) 09/22/2016      Chemistry      Component Value Date/Time   NA 130 (L) 09/22/2016 1405   K 4.0 09/22/2016 1405   CL 93 (L) 09/22/2016 1405   CO2 28 09/22/2016 1405   BUN 8 09/22/2016 1405   CREATININE 0.63 09/22/2016 1405   CREATININE 0.77 01/21/2014 1031      Component Value Date/Time   CALCIUM 9.1 09/22/2016 1405   ALKPHOS 66 09/22/2016 1405   ALKPHOS 82 01/21/2014 1031  AST 23 09/22/2016 1405   AST 17 01/21/2014 1031   ALT 12 (L) 09/22/2016 1405   ALT 18 01/21/2014 1031   BILITOT 0.5 09/22/2016 1405   BILITOT 0.4 01/21/2014 1031       ASSESSMENT & PLAN:   Cancer of upper lobe of left lung ([place]) # LUL mass- highly suspicious for malignancy. patient declined biopsy- currently on RT. Doing well except for radiation dermatitis.  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   # COPD- continue proair q 4-6 hours; continue advair. Stable.   # Erythrocytosis sec to smoking/COPD- improved.  # macrocytosis- ? Alcohol; b12 normal.   # Follow up with me in 6 weeks/ cbc-bmp. Will order CT scan at that visit.   # 25 minutes face-to-face with the patient discussing the above plan of care; more than 50% of time spent on prognosis/ natural history; counseling and coordination.     Cammie Sickle, MD 09/22/2016 3:09 PM

## 2016-09-23 ENCOUNTER — Ambulatory Visit
Admission: RE | Admit: 2016-09-23 | Discharge: 2016-09-23 | Disposition: A | Discharge status: Home / Self Care | Payer: Medicare Other | Source: Ambulatory Visit | Attending: Radiation Oncology | Admitting: Radiation Oncology

## 2016-09-23 DIAGNOSIS — Z510 Encounter for antineoplastic radiation therapy: Secondary | ICD-10-CM | POA: Diagnosis not present

## 2016-09-24 ENCOUNTER — Ambulatory Visit
Admission: RE | Admit: 2016-09-24 | Discharge: 2016-09-24 | Disposition: A | Discharge status: Home / Self Care | Payer: Medicare Other | Source: Ambulatory Visit | Attending: Radiation Oncology | Admitting: Radiation Oncology

## 2016-09-24 DIAGNOSIS — Z510 Encounter for antineoplastic radiation therapy: Secondary | ICD-10-CM | POA: Diagnosis not present

## 2016-09-27 ENCOUNTER — Ambulatory Visit
Admission: RE | Admit: 2016-09-27 | Discharge: 2016-09-27 | Disposition: A | Discharge status: Home / Self Care | Payer: Medicare Other | Source: Ambulatory Visit | Attending: Radiation Oncology | Admitting: Radiation Oncology

## 2016-09-27 ENCOUNTER — Ambulatory Visit: Payer: Medicare Other

## 2016-09-27 DIAGNOSIS — Z510 Encounter for antineoplastic radiation therapy: Secondary | ICD-10-CM | POA: Diagnosis not present

## 2016-10-25 ENCOUNTER — Other Ambulatory Visit: Payer: Self-pay | Admitting: *Deleted

## 2016-10-25 ENCOUNTER — Ambulatory Visit: Payer: Medicare Other | Admitting: Radiation Oncology

## 2016-10-28 ENCOUNTER — Encounter: Payer: Self-pay | Admitting: Radiation Oncology

## 2016-10-28 ENCOUNTER — Ambulatory Visit
Admission: RE | Admit: 2016-10-28 | Discharge: 2016-10-28 | Disposition: A | Discharge status: Home / Self Care | Payer: Medicare Other | Source: Ambulatory Visit | Attending: Radiation Oncology | Admitting: Radiation Oncology

## 2016-10-28 VITALS — BP 142/89 | HR 93 | Temp 96.9°F | Resp 20 | Wt 136.9 lb

## 2016-10-28 DIAGNOSIS — Z923 Personal history of irradiation: Secondary | ICD-10-CM | POA: Insufficient documentation

## 2016-10-28 DIAGNOSIS — C3402 Malignant neoplasm of left main bronchus: Secondary | ICD-10-CM | POA: Insufficient documentation

## 2016-10-28 DIAGNOSIS — R918 Other nonspecific abnormal finding of lung field: Secondary | ICD-10-CM

## 2016-10-28 NOTE — Progress Notes (Signed)
Radiation Oncology Follow up Note  Name: Madeline Schmitt   Date:   10/28/2016 MRN:  [id number] DOB: 1946/07/17    This 71 y.o. 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 She is seen today in routine follow-up and is doing well. Specifically denies dysphagia cough hemoptysis or bone pain.. She had significant dermatitis radiation on her back which is clear completely.  COMPLICATIONS OF TREATMENT: none  FOLLOW UP COMPLIANCE: keeps appointments   PHYSICAL EXAM:  BP (!) 142/89   Pulse 93   Temp (!) 96.9 F (36.1 C)   Resp 20   Wt 136 lb 14.5 oz (62.1 kg)   BMI 24.25 kg/m  Well-developed well-nourished patient in NAD. HEENT reveals PERLA, EOMI, discs not visualized.  Oral cavity is clear. No oral mucosal lesions are identified. Neck is clear without evidence of cervical or supraclavicular adenopathy. Lungs are clear to A&P. Cardiac examination is essentially unremarkable with regular rate and rhythm without murmur rub or thrill. Abdomen is benign with no organomegaly or masses noted. Motor sensory and DTR levels are equal and symmetric in the upper and lower extremities. Cranial nerves II through XII are grossly intact. Proprioception is intact. No peripheral adenopathy or edema is identified. No motor or sensory levels are noted. Crude visual fields are within normal range.  RADIOLOGY RESULTS: No current films for review  PLAN: Present time she is doing well. She is recovering nicely from her radiation therapy treatments. She will see medical oncology later this week. They will be ordering follow-up CT scans in about a month. I will review that when  it becomes available. Otherwise I've asked to see her out in about 4 months for follow-up. I'm please were overall progress.  I would like to take this opportunity to thank you for allowing me to participate in the care of your patient.Armstead Peaks., MD

## 2016-11-01 ENCOUNTER — Encounter: Payer: Self-pay | Admitting: Internal Medicine

## 2016-11-01 ENCOUNTER — Ambulatory Visit (INDEPENDENT_AMBULATORY_CARE_PROVIDER_SITE_OTHER): Payer: Medicare Other | Admitting: Internal Medicine

## 2016-11-01 VITALS — BP 132/66 | HR 90 | Ht 63.0 in | Wt 136.0 lb

## 2016-11-01 DIAGNOSIS — J9611 Chronic respiratory failure with hypoxia: Secondary | ICD-10-CM

## 2016-11-01 NOTE — Patient Instructions (Signed)
Change advair dose 250/50 daily Albuterol as needed

## 2016-11-01 NOTE — Progress Notes (Signed)
[facility] Consultation      Date: 11/01/2016,   MRN# [id number] Madeline Schmitt Oct 19, 1945 Code Status:  Code Status History    This patient does not have a recorded code status. Please follow your organizational policy for patients in this situation.     Hosp day:'@[email or web address]'$ @ Referring MD: '@ATDPROV'$ @     PCP:      AdmissionWeight: 136 lb (61.7 kg)                 CurrentWeight: 136 lb (61.7 kg) Madeline Schmitt is a 71 y.o. old female seen in consultation for copd and lung mass at the request of Dr. Rogue Bussing.     CHIEF COMPLAINT:   Follow up COPD, follow up Lung mass   HISTORY OF PRESENT ILLNESS  Patient refused lung biopsy and just finished 35 treatments of RXT Uses oxygen at night Uses 500/50 advair No acute issues at this time Has chronic SOB and DOE No signs of infection at this time  Current Medication:  Current Outpatient Prescriptions:  .  ADVAIR DISKUS 500-50 MCG/DOSE AEPB, , Disp: , Rfl: 1 .  albuterol (PROAIR HFA) 108 (90 Base) MCG/ACT inhaler, Inhale 2 puffs into the lungs every 6 (six) hours as needed for wheezing or shortness of breath., Disp: , Rfl:  .  amLODipine (NORVASC) 2.5 MG tablet, Take 2.5 mg by mouth daily., Disp: , Rfl:  .  aspirin 81 MG tablet, Take 81 mg by mouth daily., Disp: , Rfl:  .  atenolol (TENORMIN) 100 MG tablet, Take 100 mg by mouth daily. , Disp: , Rfl: 0 .  Cholecalciferol 1000 units tablet, Take 1,000 Units by mouth daily., Disp: , Rfl:  .  clonazePAM (KLONOPIN) 0.5 MG tablet, Take 0.5 mg by mouth 2 (two) times daily as needed for anxiety., Disp: , Rfl:  .  fluticasone (FLONASE) 50 MCG/ACT nasal spray, Place into both nostrils daily as needed. , Disp: , Rfl:  .  omeprazole (PRILOSEC) 20 MG capsule, Take 20 mg by mouth daily as needed. , Disp: , Rfl:  .  QUEtiapine (SEROQUEL) 25 MG tablet, Take 25 mg by mouth at bedtime., Disp: , Rfl: 0    ALLERGIES   Augmentin [amoxicillin-pot clavulanate];  Penicillins; Ace inhibitors; Actonel [risedronate sodium]; Celexa [citalopram hydrobromide]; Ciprofloxacin; Dicyclomine hcl; Maxzide [triamterene-hctz]; Nicoderm [nicotine]; Nsaids; Oxycodone; and Zyban [bupropion]     REVIEW OF SYSTEMS   Review of Systems  Constitutional: Negative for chills, diaphoresis, fever, malaise/fatigue and weight loss.  HENT: Negative for congestion and hearing loss.   Eyes: Negative for blurred vision and double vision.  Respiratory: Positive for shortness of breath. Negative for cough, hemoptysis, sputum production and wheezing.   Cardiovascular: Negative for chest pain, palpitations and orthopnea.  Gastrointestinal: Negative for abdominal pain, heartburn, nausea and vomiting.  Skin: Negative for rash.  Neurological: Negative for weakness.  All other systems reviewed and are negative.    VS: BP 132/66 (BP Location: Left Arm, Cuff Size: Normal)   Pulse 90   Ht '5\' 3"'$  (1.6 m)   Wt 136 lb (61.7 kg)   SpO2 91%   BMI 24.09 kg/m      PHYSICAL EXAM  Physical Exam  Constitutional: She is oriented to person, place, and time. She appears well-developed and well-nourished. No distress.  HENT:  Mouth/Throat: No oropharyngeal exudate.  Eyes: No scleral icterus.  Neck: Neck supple.  Cardiovascular: Normal rate, regular rhythm and normal heart sounds.   No murmur heard. Pulmonary/Chest: Effort  normal and breath sounds normal. No stridor. No respiratory distress. She has no wheezes.  Musculoskeletal: Normal range of motion. She exhibits no edema.  Neurological: She is alert and oriented to person, place, and time. No cranial nerve deficit.  Skin: Skin is warm. She is not diaphoretic.  Psychiatric: She has a normal mood and affect.       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 Patient has been doing well with RXT by Rad Onc  1.continue oxygen for her COPD 2.continue advair and albuterol as needed, change does of advair to 250/50 3.Left lung mass with adenopathy-s/p RXT follow up oncology  4.smoking cessation strongly advised   Patient satisfied with Plan of action and management. All questions answered  Corrin Parker, M.D.  [facility]  Medical Director [place] Director [facility] Cardio-Pulmonary Department

## 2016-11-05 ENCOUNTER — Inpatient Hospital Stay (HOSPITAL_BASED_OUTPATIENT_CLINIC_OR_DEPARTMENT_OTHER): Payer: Medicare Other | Admitting: Internal Medicine

## 2016-11-05 ENCOUNTER — Inpatient Hospital Stay: Payer: Medicare Other | Attending: Internal Medicine

## 2016-11-05 VITALS — BP 136/74 | HR 91 | Temp 97.6°F | Resp 18 | Wt 137.1 lb

## 2016-11-05 DIAGNOSIS — L598 Other specified disorders of the skin and subcutaneous tissue related to radiation: Secondary | ICD-10-CM

## 2016-11-05 DIAGNOSIS — Z79899 Other long term (current) drug therapy: Secondary | ICD-10-CM

## 2016-11-05 DIAGNOSIS — K589 Irritable bowel syndrome without diarrhea: Secondary | ICD-10-CM

## 2016-11-05 DIAGNOSIS — Z8781 Personal history of (healed) traumatic fracture: Secondary | ICD-10-CM

## 2016-11-05 DIAGNOSIS — Z923 Personal history of irradiation: Secondary | ICD-10-CM | POA: Insufficient documentation

## 2016-11-05 DIAGNOSIS — Z801 Family history of malignant neoplasm of trachea, bronchus and lung: Secondary | ICD-10-CM | POA: Insufficient documentation

## 2016-11-05 DIAGNOSIS — F17210 Nicotine dependence, cigarettes, uncomplicated: Secondary | ICD-10-CM | POA: Diagnosis not present

## 2016-11-05 DIAGNOSIS — C3412 Malignant neoplasm of upper lobe, left bronchus or lung: Secondary | ICD-10-CM | POA: Diagnosis not present

## 2016-11-05 DIAGNOSIS — I10 Essential (primary) hypertension: Secondary | ICD-10-CM

## 2016-11-05 DIAGNOSIS — Z7982 Long term (current) use of aspirin: Secondary | ICD-10-CM | POA: Diagnosis not present

## 2016-11-05 DIAGNOSIS — Z87891 Personal history of nicotine dependence: Secondary | ICD-10-CM | POA: Insufficient documentation

## 2016-11-05 DIAGNOSIS — J449 Chronic obstructive pulmonary disease, unspecified: Secondary | ICD-10-CM | POA: Insufficient documentation

## 2016-11-05 DIAGNOSIS — I2510 Atherosclerotic heart disease of native coronary artery without angina pectoris: Secondary | ICD-10-CM | POA: Insufficient documentation

## 2016-11-05 DIAGNOSIS — M069 Rheumatoid arthritis, unspecified: Secondary | ICD-10-CM

## 2016-11-05 DIAGNOSIS — D472 Monoclonal gammopathy: Secondary | ICD-10-CM | POA: Diagnosis not present

## 2016-11-05 DIAGNOSIS — E785 Hyperlipidemia, unspecified: Secondary | ICD-10-CM | POA: Diagnosis not present

## 2016-11-05 DIAGNOSIS — Z808 Family history of malignant neoplasm of other organs or systems: Secondary | ICD-10-CM | POA: Diagnosis not present

## 2016-11-05 DIAGNOSIS — M818 Other osteoporosis without current pathological fracture: Secondary | ICD-10-CM | POA: Insufficient documentation

## 2016-11-05 DIAGNOSIS — E119 Type 2 diabetes mellitus without complications: Secondary | ICD-10-CM | POA: Diagnosis not present

## 2016-11-05 LAB — BASIC METABOLIC PANEL
ANION GAP: 10 (ref 5–15)
BUN: 10 mg/dL (ref 6–20)
CHLORIDE: 95 mmol/L — AB (ref 101–111)
CO2: 27 mmol/L (ref 22–32)
Calcium: 9.5 mg/dL (ref 8.9–10.3)
Creatinine, Ser: 0.62 mg/dL (ref 0.44–1.00)
GFR calc non Af Amer: >60 mL/min (ref >60)
Glucose, Bld: 118 mg/dL — ABNORMAL HIGH (ref 65–99)
Potassium: 4.2 mmol/L (ref 3.5–5.1)
SODIUM: 132 mmol/L — AB (ref 135–145)

## 2016-11-05 LAB — CBC WITH DIFFERENTIAL/PLATELET
BASOS ABS: 0.1 10*3/uL (ref 0–0.1)
BASOS PCT: 1 %
Eosinophils Absolute: 0.1 10*3/uL (ref 0–0.7)
Eosinophils Relative: 2 %
HEMATOCRIT: 41.1 % (ref 35.0–47.0)
HEMOGLOBIN: 14.5 g/dL (ref 12.0–16.0)
LYMPHS PCT: 15 %
Lymphs Abs: 1.1 10*3/uL (ref 1.0–3.6)
MCH: 38.8 pg — ABNORMAL HIGH (ref 26.0–34.0)
MCHC: 35.2 g/dL (ref 32.0–36.0)
MCV: 110.1 fL — ABNORMAL HIGH (ref 80.0–100.0)
Monocytes Absolute: 0.6 10*3/uL (ref 0.2–0.9)
Monocytes Relative: 8 %
Neutro Abs: 5.5 10*3/uL (ref 1.4–6.5)
Neutrophils Relative %: 74 %
Platelets: 147 10*3/uL — ABNORMAL LOW (ref 150–440)
RBC: 3.73 MIL/uL — AB (ref 3.80–5.20)
RDW: 13.8 % (ref 11.5–14.5)
WBC: 7.4 10*3/uL (ref 3.6–11.0)

## 2016-11-05 NOTE — Progress Notes (Signed)
Patient here today for follow up.  Patient states no new concerns today  

## 2016-11-05 NOTE — Assessment & Plan Note (Signed)
#   LUL mass- highly suspicious for malignancy. patient declined biopsy- currently s/p  RT [last feb 12th]. Doing well; no concerns for radiation pneumonitis or progression. Plan CT prior to next visit.   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 .   # COPD- Stable. continue proair q 4-6 hours; continue advair. Stable.   # Follow up with me in 2 months; CT scan prior./ no labs

## 2016-11-05 NOTE — Progress Notes (Signed)
[place] OFFICE PROGRESS NOTE  Patient Care Team: Adin Hector, MD as PCP - General (Internal Medicine)   SUMMARY OF ONCOLOGIC HISTORY:  Oncology History   # NOV 2017- LUL MASS PET- SUV 12; Pt DECLINED BIOPSY. DEC 1st 2017- RT [Dr.Crystal]  # ERYTHROCYTOSIS- likely sec to smoking [Jak-2/exon 12- NEG]  # Hx of smoking- does not want to quit.      Cancer of upper lobe of left lung ([place])   07/07/2016 Initial Diagnosis    Cancer of upper lobe of left lung ([place])       INTERVAL HISTORY:  A very pleasant 71 year old female patient with long-standing history of smoking- Left upper lobe mass- highly concerning for malignancy s/p radiation on feb 12th.  Skin-no radiation is improved. Denies any cough. Denies any worsening shortness of breath or hemoptysis. No headaches. Denies any leg swelling. Unfortunately she continues to smoke. Drinks wine everyday.   REVIEW OF SYSTEMS:  A complete 10 point review of system is done which is negative except mentioned above/history of present illness.   PAST MEDICAL HISTORY :  Past Medical History:  Diagnosis Date  . Aortic atherosclerosis ([place])   . Arrhythmia   . COPD (chronic obstructive pulmonary disease) ([place])   . Diabetes mellitus type 2, uncomplicated ([place])   . Essential hypertension   . History of pelvic fracture   . Hyperlipidemia, unspecified   . IBS (irritable bowel syndrome)   . Lung cancer ([place])   . Lung mass   . Osteoporosis   . Rheumatoid arthritis involving multiple sites with positive rheumatoid factor ([place])   . Secondary polycythemia   ; HTN; COPD  PAST SURGICAL HISTORY :   Past Surgical History:  Procedure Laterality Date  . BREAST BIOPSY Bilateral 1970   negative  . COLONOSCOPY     03/30/2002, 12/25/2004, 03/17/2007, 05/08/2010  . Resection of chronic olecranon bursitis    . S/P oral surgery      FAMILY HISTORY :   Family History  Problem Relation Age of Onset  . Lung cancer Brother   .  Bone cancer Brother   . Breast cancer Neg Hx     SOCIAL HISTORY:  History of smoking; denies alcohol abuse.  Social History  Substance Use Topics  . Smoking status: Current Some Day Smoker    Packs/day: 0.50    Years: 55.00  . Smokeless tobacco: Never Used  . Alcohol use 8.4 oz/week    14 Glasses of wine per week     Comment: 1-3 glasses of wine a day    ALLERGIES:  is allergic to augmentin [amoxicillin-pot clavulanate]; penicillins; ace inhibitors; actonel [risedronate sodium]; celexa [citalopram hydrobromide]; ciprofloxacin; dicyclomine hcl; maxzide [triamterene-hctz]; nicoderm [nicotine]; nsaids; oxycodone; and zyban [bupropion].  MEDICATIONS:  Current Outpatient Prescriptions  Medication Sig Dispense Refill  . ADVAIR DISKUS 500-50 MCG/DOSE AEPB   1  . albuterol (PROAIR HFA) 108 (90 Base) MCG/ACT inhaler Inhale 2 puffs into the lungs every 6 (six) hours as needed for wheezing or shortness of breath.    Marland Kitchen amLODipine (NORVASC) 2.5 MG tablet Take 2.5 mg by mouth daily.    Marland Kitchen aspirin 81 MG tablet Take 81 mg by mouth daily.    Marland Kitchen atenolol (TENORMIN) 100 MG tablet Take 100 mg by mouth daily.   0  . Cholecalciferol 1000 units tablet Take 1,000 Units by mouth daily.    . clonazePAM (KLONOPIN) 0.5 MG tablet Take 0.5 mg by mouth 2 (two) times daily  as needed for anxiety.    . fluticasone (FLONASE) 50 MCG/ACT nasal spray Place into both nostrils daily as needed.     Marland Kitchen omeprazole (PRILOSEC) 20 MG capsule Take 20 mg by mouth daily as needed.     Marland Kitchen QUEtiapine (SEROQUEL) 25 MG tablet Take 25 mg by mouth at bedtime.  0   No current facility-administered medications for this visit.     PHYSICAL EXAMINATION:   BP 136/74 (BP Location: Left Arm, Patient Position: Sitting)   Pulse 91   Temp 97.6 F (36.4 C) (Tympanic)   Resp 18   Wt 137 lb 2 oz (62.2 kg)   SpO2 94%   BMI 24.29 kg/m   Filed Weights   11/05/16 1444  Weight: 137 lb 2 oz (62.2 kg)    GENERAL: Well-nourished  well-developed; Alert, no distress and comfortable.  Alone. EYES: no pallor or icterus OROPHARYNX: no thrush or ulceration; dentures.  NECK: supple, no masses felt LYMPH:  no palpable lymphadenopathy in the cervical, axillary or inguinal regions LUNGS: Decreased breath sounds to auscultation and  Scattered wheezing HEART/CVS: regular rate & rhythm and no murmurs; No lower extremity edema ABDOMEN:abdomen soft, non-tender and normal bowel sounds Musculoskeletal:no cyanosis of digits and no clubbing  PSYCH: alert & oriented x 3 with fluent speech NEURO: no focal motor/sensory deficits SKIN:  Rash noted in radiation portal.   LABORATORY DATA:  I have reviewed the data as listed    Component Value Date/Time   NA 132 (L) 11/05/2016 1427   K 4.2 11/05/2016 1427   CL 95 (L) 11/05/2016 1427   CO2 27 11/05/2016 1427   GLUCOSE 118 (H) 11/05/2016 1427   BUN 10 11/05/2016 1427   CREATININE 0.62 11/05/2016 1427   CREATININE 0.77 01/21/2014 1031   CALCIUM 9.5 11/05/2016 1427   PROT 7.7 09/22/2016 1405   PROT 8.4 (H) 01/21/2014 1031   ALBUMIN 3.8 09/22/2016 1405   ALBUMIN 3.8 01/21/2014 1031   AST 23 09/22/2016 1405   AST 17 01/21/2014 1031   ALT 12 (L) 09/22/2016 1405   ALT 18 01/21/2014 1031   ALKPHOS 66 09/22/2016 1405   ALKPHOS 82 01/21/2014 1031   BILITOT 0.5 09/22/2016 1405   BILITOT 0.4 01/21/2014 1031   GFRNONAA >60 11/05/2016 1427   GFRNONAA >60 01/21/2014 1031   GFRAA >60 11/05/2016 1427   GFRAA >60 01/21/2014 1031    No results found for: SPEP, UPEP  Lab Results  Component Value Date   WBC 7.4 11/05/2016   NEUTROABS 5.5 11/05/2016   HGB 14.5 11/05/2016   HCT 41.1 11/05/2016   MCV 110.1 (H) 11/05/2016   PLT 147 (L) 11/05/2016      Chemistry      Component Value Date/Time   NA 132 (L) 11/05/2016 1427   K 4.2 11/05/2016 1427   CL 95 (L) 11/05/2016 1427   CO2 27 11/05/2016 1427   BUN 10 11/05/2016 1427   CREATININE 0.62 11/05/2016 1427   CREATININE 0.77  01/21/2014 1031      Component Value Date/Time   CALCIUM 9.5 11/05/2016 1427   ALKPHOS 66 09/22/2016 1405   ALKPHOS 82 01/21/2014 1031   AST 23 09/22/2016 1405   AST 17 01/21/2014 1031   ALT 12 (L) 09/22/2016 1405   ALT 18 01/21/2014 1031   BILITOT 0.5 09/22/2016 1405   BILITOT 0.4 01/21/2014 1031       ASSESSMENT & PLAN:   Cancer of upper lobe of left lung ([place]) # LUL mass-  highly suspicious for malignancy. patient declined biopsy- currently s/p  RT [last feb 12th]. Doing well; no concerns for radiation pneumonitis or progression. Plan CT prior to next visit.   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 .   # COPD- Stable. continue proair q 4-6 hours; continue advair. Stable.   # Follow up with me in 2 months; CT scan prior./ no labs     Cammie Sickle, MD 11/05/2016 5:46 PM

## 2017-01-05 ENCOUNTER — Ambulatory Visit
Admission: RE | Admit: 2017-01-05 | Discharge: 2017-01-05 | Disposition: A | Discharge status: Home / Self Care | Payer: Medicare Other | Source: Ambulatory Visit | Attending: Internal Medicine | Admitting: Internal Medicine

## 2017-01-05 DIAGNOSIS — J432 Centrilobular emphysema: Secondary | ICD-10-CM | POA: Diagnosis not present

## 2017-01-05 DIAGNOSIS — C3412 Malignant neoplasm of upper lobe, left bronchus or lung: Secondary | ICD-10-CM

## 2017-01-05 LAB — POCT I-STAT CREATININE: Creatinine, Ser: 0.6 mg/dL (ref 0.44–1.00)

## 2017-01-05 MED ORDER — IOPAMIDOL (ISOVUE-300) INJECTION 61%
75.0000 mL | Freq: Once | INTRAVENOUS | Status: AC | PRN
  Administered 2017-01-05: 75 mL via INTRAVENOUS

## 2017-01-07 ENCOUNTER — Inpatient Hospital Stay: Payer: Medicare Other | Attending: Internal Medicine | Admitting: Internal Medicine

## 2017-01-07 VITALS — BP 135/86 | HR 89 | Temp 97.3°F | Wt 133.2 lb

## 2017-01-07 DIAGNOSIS — Z801 Family history of malignant neoplasm of trachea, bronchus and lung: Secondary | ICD-10-CM | POA: Diagnosis not present

## 2017-01-07 DIAGNOSIS — I499 Cardiac arrhythmia, unspecified: Secondary | ICD-10-CM | POA: Diagnosis not present

## 2017-01-07 DIAGNOSIS — R0602 Shortness of breath: Secondary | ICD-10-CM | POA: Diagnosis not present

## 2017-01-07 DIAGNOSIS — I700 Atherosclerosis of aorta: Secondary | ICD-10-CM

## 2017-01-07 DIAGNOSIS — E785 Hyperlipidemia, unspecified: Secondary | ICD-10-CM | POA: Diagnosis not present

## 2017-01-07 DIAGNOSIS — E119 Type 2 diabetes mellitus without complications: Secondary | ICD-10-CM | POA: Diagnosis not present

## 2017-01-07 DIAGNOSIS — J449 Chronic obstructive pulmonary disease, unspecified: Secondary | ICD-10-CM | POA: Diagnosis not present

## 2017-01-07 DIAGNOSIS — K589 Irritable bowel syndrome without diarrhea: Secondary | ICD-10-CM | POA: Diagnosis not present

## 2017-01-07 DIAGNOSIS — Z808 Family history of malignant neoplasm of other organs or systems: Secondary | ICD-10-CM | POA: Diagnosis not present

## 2017-01-07 DIAGNOSIS — I10 Essential (primary) hypertension: Secondary | ICD-10-CM | POA: Insufficient documentation

## 2017-01-07 DIAGNOSIS — D751 Secondary polycythemia: Secondary | ICD-10-CM | POA: Diagnosis not present

## 2017-01-07 DIAGNOSIS — M818 Other osteoporosis without current pathological fracture: Secondary | ICD-10-CM | POA: Diagnosis not present

## 2017-01-07 DIAGNOSIS — Z7982 Long term (current) use of aspirin: Secondary | ICD-10-CM | POA: Diagnosis not present

## 2017-01-07 DIAGNOSIS — C3412 Malignant neoplasm of upper lobe, left bronchus or lung: Secondary | ICD-10-CM | POA: Diagnosis not present

## 2017-01-07 DIAGNOSIS — F17210 Nicotine dependence, cigarettes, uncomplicated: Secondary | ICD-10-CM | POA: Insufficient documentation

## 2017-01-07 DIAGNOSIS — M2550 Pain in unspecified joint: Secondary | ICD-10-CM | POA: Diagnosis not present

## 2017-01-07 DIAGNOSIS — Z79899 Other long term (current) drug therapy: Secondary | ICD-10-CM | POA: Diagnosis not present

## 2017-01-07 DIAGNOSIS — M069 Rheumatoid arthritis, unspecified: Secondary | ICD-10-CM | POA: Diagnosis not present

## 2017-01-07 DIAGNOSIS — G8929 Other chronic pain: Secondary | ICD-10-CM | POA: Insufficient documentation

## 2017-01-07 NOTE — Assessment & Plan Note (Addendum)
#   LUL mass- highly suspicious for malignancy. patient declined biopsy- currently s/p  RT [last feb 12th]. CT scan shows improvement of the- left upper lobe lesion- left suprahilar lesion currently measuring about 2 x 1.5 cm; prior to treatment was approximately 2.7 x 2.3 cm. No new lesions noted.  # COPD- Stable. continue proair q 4-6 hours; continue advair. Stable.   # Active smoker- patient is not interested in smoking cessation.  # Follow up with me in 4 months; CT scan prior./ labs.   I reviewed the images myself and with the patient- I would recommend continued surveillance at this time.

## 2017-01-07 NOTE — Progress Notes (Signed)
Patient states she's having a cough with a little bit of mucus.  Patient denies pain at this time, vitals stable and documented.  Patient ambulated without assistance, brought to exam room 17

## 2017-01-07 NOTE — Progress Notes (Signed)
[place] OFFICE PROGRESS NOTE  Patient Care Team: Adin Hector, MD as PCP - General (Internal Medicine)   SUMMARY OF ONCOLOGIC HISTORY:  Oncology History   # NOV 2017- LUL MASS PET- SUV 12; Pt DECLINED BIOPSY. DEC 1st 2017- RT [Dr.Crystal];CT May 23rd 2018- PR  # ERYTHROCYTOSIS- likely sec to smoking [Jak-2/exon 12- NEG]  # Hx of smoking- does not want to quit.; RA- [? Dr.Kernodle]..      Malignant neoplasm of upper lobe of left lung ([place])     INTERVAL HISTORY:  A very pleasant 71 year old female patient with long-standing history of smoking- Left upper lobe mass- highly concerning for malignancy s/p radiation on feb 12th; Patient is here to review the results of the restaging CAT scan.  Patient continues to complain of chronic joint pains. Has chronic shortness of breath. Otherwise denies any hemoptysis. Complains of chronic cough with sputum especially the mornings.  No headaches. Denies any leg swelling. Unfortunately she continues to smoke. Drinks wine everyday.   REVIEW OF SYSTEMS:  A complete 10 point review of system is done which is negative except mentioned above/history of present illness.   PAST MEDICAL HISTORY :  Past Medical History:  Diagnosis Date  . Aortic atherosclerosis ([place])   . Arrhythmia   . COPD (chronic obstructive pulmonary disease) ([place])   . Diabetes mellitus type 2, uncomplicated ([place])   . Essential hypertension   . History of pelvic fracture   . Hyperlipidemia, unspecified   . IBS (irritable bowel syndrome)   . Lung cancer ([place])   . Lung mass   . Osteoporosis   . Rheumatoid arthritis involving multiple sites with positive rheumatoid factor ([place])   . Secondary polycythemia   ; HTN; COPD  PAST SURGICAL HISTORY :   Past Surgical History:  Procedure Laterality Date  . BREAST BIOPSY Bilateral 1970   negative  . COLONOSCOPY     03/30/2002, 12/25/2004, 03/17/2007, 05/08/2010  . Resection of chronic olecranon bursitis     . S/P oral surgery      FAMILY HISTORY :   Family History  Problem Relation Age of Onset  . Lung cancer Brother   . Bone cancer Brother   . Breast cancer Neg Hx     SOCIAL HISTORY:  History of smoking; denies alcohol abuse.  Social History  Substance Use Topics  . Smoking status: Current Some Day Smoker    Packs/day: 0.50    Years: 55.00  . Smokeless tobacco: Never Used  . Alcohol use 8.4 oz/week    14 Glasses of wine per week     Comment: 1-3 glasses of wine a day    ALLERGIES:  is allergic to augmentin [amoxicillin-pot clavulanate]; penicillins; ace inhibitors; actonel [risedronate sodium]; celexa [citalopram hydrobromide]; ciprofloxacin; dicyclomine hcl; hydrochlorothiazide w-triamterene; maxzide [triamterene-hctz]; nicoderm [nicotine]; nsaids; oxycodone; and zyban [bupropion].  MEDICATIONS:  Current Outpatient Prescriptions  Medication Sig Dispense Refill  . ADVAIR DISKUS 500-50 MCG/DOSE AEPB   1  . albuterol (PROAIR HFA) 108 (90 Base) MCG/ACT inhaler Inhale 2 puffs into the lungs every 6 (six) hours as needed for wheezing or shortness of breath.    Marland Kitchen amLODipine (NORVASC) 2.5 MG tablet Take 2.5 mg by mouth daily.    Marland Kitchen aspirin 81 MG tablet Take 81 mg by mouth daily.    Marland Kitchen atenolol (TENORMIN) 100 MG tablet Take 100 mg by mouth daily.   0  . Cholecalciferol 1000 units tablet Take 1,000 Units by mouth daily.    Marland Kitchen  clonazePAM (KLONOPIN) 0.5 MG tablet Take 0.5 mg by mouth 2 (two) times daily as needed for anxiety.    . fluticasone (FLONASE) 50 MCG/ACT nasal spray Place into both nostrils daily as needed.     Marland Kitchen omeprazole (PRILOSEC) 20 MG capsule Take 20 mg by mouth daily as needed.     Marland Kitchen QUEtiapine (SEROQUEL) 25 MG tablet Take 25 mg by mouth at bedtime.  0   No current facility-administered medications for this visit.     PHYSICAL EXAMINATION:   BP 135/86 (BP Location: Left Arm, Patient Position: Sitting)   Pulse 89   Temp 97.3 F (36.3 C) (Tympanic)   Wt 133 lb 3.2 oz  (60.4 kg)   BMI 23.60 kg/m   Filed Weights   01/07/17 1503  Weight: 133 lb 3.2 oz (60.4 kg)    GENERAL: Well-nourished well-developed; Alert, no distress and comfortable.  Alone. EYES: no pallor or icterus OROPHARYNX: no thrush or ulceration; dentures.  NECK: supple, no masses felt LYMPH:  no palpable lymphadenopathy in the cervical, axillary or inguinal regions LUNGS: Decreased breath sounds to auscultation and  Scattered wheezing HEART/CVS: regular rate & rhythm and no murmurs; No lower extremity edema ABDOMEN:abdomen soft, non-tender and normal bowel sounds Musculoskeletal:no cyanosis of digits and no clubbing  PSYCH: alert & oriented x 3 with fluent speech NEURO: no focal motor/sensory deficits SKIN:  Rash noted in radiation portal.   LABORATORY DATA:  I have reviewed the data as listed    Component Value Date/Time   NA 132 (L) 11/05/2016 1427   K 4.2 11/05/2016 1427   CL 95 (L) 11/05/2016 1427   CO2 27 11/05/2016 1427   GLUCOSE 118 (H) 11/05/2016 1427   BUN 10 11/05/2016 1427   CREATININE 0.60 01/05/2017 1304   CREATININE 0.77 01/21/2014 1031   CALCIUM 9.5 11/05/2016 1427   PROT 7.7 09/22/2016 1405   PROT 8.4 (H) 01/21/2014 1031   ALBUMIN 3.8 09/22/2016 1405   ALBUMIN 3.8 01/21/2014 1031   AST 23 09/22/2016 1405   AST 17 01/21/2014 1031   ALT 12 (L) 09/22/2016 1405   ALT 18 01/21/2014 1031   ALKPHOS 66 09/22/2016 1405   ALKPHOS 82 01/21/2014 1031   BILITOT 0.5 09/22/2016 1405   BILITOT 0.4 01/21/2014 1031   GFRNONAA >60 11/05/2016 1427   GFRNONAA >60 01/21/2014 1031   GFRAA >60 11/05/2016 1427   GFRAA >60 01/21/2014 1031    No results found for: SPEP, UPEP  Lab Results  Component Value Date   WBC 7.4 11/05/2016   NEUTROABS 5.5 11/05/2016   HGB 14.5 11/05/2016   HCT 41.1 11/05/2016   MCV 110.1 (H) 11/05/2016   PLT 147 (L) 11/05/2016      Chemistry      Component Value Date/Time   NA 132 (L) 11/05/2016 1427   K 4.2 11/05/2016 1427   CL 95 (L)  11/05/2016 1427   CO2 27 11/05/2016 1427   BUN 10 11/05/2016 1427   CREATININE 0.60 01/05/2017 1304   CREATININE 0.77 01/21/2014 1031      Component Value Date/Time   CALCIUM 9.5 11/05/2016 1427   ALKPHOS 66 09/22/2016 1405   ALKPHOS 82 01/21/2014 1031   AST 23 09/22/2016 1405   AST 17 01/21/2014 1031   ALT 12 (L) 09/22/2016 1405   ALT 18 01/21/2014 1031   BILITOT 0.5 09/22/2016 1405   BILITOT 0.4 01/21/2014 1031     IMPRESSION: 1. Reduction in size of LEFT suprahilar nodule. 2. No evidence  of disease progression. 3. Centrilobular emphysema.   Electronically Signed   By: Suzy Bouchard M.D.   On: 01/05/2017 15:59  ASSESSMENT & PLAN:   Malignant neoplasm of upper lobe of left lung ([place]) # LUL mass- highly suspicious for malignancy. patient declined biopsy- currently s/p  RT [last feb 12th]. CT scan shows improvement of the- left upper lobe lesion- left suprahilar lesion currently measuring about 2 x 1.5 cm; prior to treatment was approximately 2.7 x 2.3 cm. No new lesions noted.  # COPD- Stable. continue proair q 4-6 hours; continue advair. Stable.   # Active smoker- patient is not interested in smoking cessation.  # Follow up with me in 4 months; CT scan prior./ labs.   I reviewed the images myself and with the patient- I would recommend continued surveillance at this time.     Cammie Sickle, MD 01/08/2017 5:52 PM

## 2017-03-10 ENCOUNTER — Encounter: Payer: Self-pay | Admitting: Radiation Oncology

## 2017-03-10 ENCOUNTER — Other Ambulatory Visit: Payer: Self-pay | Admitting: *Deleted

## 2017-03-10 ENCOUNTER — Ambulatory Visit
Admission: RE | Admit: 2017-03-10 | Discharge: 2017-03-10 | Disposition: A | Discharge status: Home / Self Care | Payer: Medicare Other | Source: Ambulatory Visit | Attending: Radiation Oncology | Admitting: Radiation Oncology

## 2017-03-10 VITALS — BP 136/81 | HR 98 | Temp 98.1°F | Resp 20 | Wt 130.1 lb

## 2017-03-10 DIAGNOSIS — F17210 Nicotine dependence, cigarettes, uncomplicated: Secondary | ICD-10-CM | POA: Insufficient documentation

## 2017-03-10 DIAGNOSIS — R05 Cough: Secondary | ICD-10-CM | POA: Insufficient documentation

## 2017-03-10 DIAGNOSIS — R918 Other nonspecific abnormal finding of lung field: Secondary | ICD-10-CM

## 2017-03-10 DIAGNOSIS — R110 Nausea: Secondary | ICD-10-CM | POA: Diagnosis not present

## 2017-03-10 DIAGNOSIS — J432 Centrilobular emphysema: Secondary | ICD-10-CM | POA: Diagnosis not present

## 2017-03-10 DIAGNOSIS — Z9221 Personal history of antineoplastic chemotherapy: Secondary | ICD-10-CM | POA: Diagnosis not present

## 2017-03-10 DIAGNOSIS — C3402 Malignant neoplasm of left main bronchus: Secondary | ICD-10-CM | POA: Diagnosis not present

## 2017-03-10 DIAGNOSIS — Z923 Personal history of irradiation: Secondary | ICD-10-CM | POA: Insufficient documentation

## 2017-03-10 MED ORDER — ONDANSETRON HCL 8 MG PO TABS: 8.0000 mg | ORAL_TABLET | Freq: Three times a day (TID) | ORAL | 2 refills | Status: DC | PRN

## 2017-03-10 NOTE — Progress Notes (Signed)
Radiation Oncology Follow up Note  Name: Madeline Schmitt   Date:   03/10/2017 MRN:  [id number] DOB: [birth year]    This 71 y.o. 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  REFERRING PROVIDER: Adin Hector, MD  HPI: Patient is a 71 year old female now out 5 months having completed radiation therapy with curative intent to her left hilum for stage IIa (T2 N0 M0) non-small cell lung cancer seen today in routine follow-up she is doing fairly well she states she does have a productive cough mostly clear sputum no hemoptysis.. She had a CT scan back in May showing reduction in the left sub-suprahilar nodule no evidence of disease disease progression. She does have centrilobular emphysema.  COMPLICATIONS OF TREATMENT: none  FOLLOW UP COMPLIANCE: keeps appointments   PHYSICAL EXAM:  BP 136/81   Pulse 98   Temp 98.1 F (36.7 C)   Resp 20   Wt 130 lb 1.1 oz (59 kg)   BMI 23.04 kg/m  Well-developed well-nourished patient in NAD. HEENT reveals PERLA, EOMI, discs not visualized.  Oral cavity is clear. No oral mucosal lesions are identified. Neck is clear without evidence of cervical or supraclavicular adenopathy. Lungs are clear to A&P. Cardiac examination is essentially unremarkable with regular rate and rhythm without murmur rub or thrill. Abdomen is benign with no organomegaly or masses noted. Motor sensory and DTR levels are equal and symmetric in the upper and lower extremities. Cranial nerves II through XII are grossly intact. Proprioception is intact. No peripheral adenopathy or edema is identified. No motor or sensory levels are noted. Crude visual fields are within normal range.  RADIOLOGY RESULTS: Recent CT scan is reviewed and compatible with the above-stated findings  PLAN: Present time she is doing well have recommended Mucinex for her productive cough. She also  is having some nausea were starting on antinausea medication when necessary. Otherwise I'm please were overall progress. I've have reviewed her CT scan showing marked treatment response. I've asked to see her back in 6 months for follow-up. Patient is to call sooner with any concerns.      Armstead Peaks., MD

## 2017-04-06 ENCOUNTER — Other Ambulatory Visit: Payer: No Typology Code available for payment source

## 2017-04-06 ENCOUNTER — Ambulatory Visit: Payer: No Typology Code available for payment source | Admitting: Internal Medicine

## 2017-05-09 ENCOUNTER — Ambulatory Visit
Admission: RE | Admit: 2017-05-09 | Discharge: 2017-05-09 | Disposition: A | Discharge status: Home / Self Care | Payer: Medicare Other | Source: Ambulatory Visit | Attending: Internal Medicine | Admitting: Internal Medicine

## 2017-05-09 DIAGNOSIS — Z9889 Other specified postprocedural states: Secondary | ICD-10-CM | POA: Insufficient documentation

## 2017-05-09 DIAGNOSIS — I700 Atherosclerosis of aorta: Secondary | ICD-10-CM | POA: Insufficient documentation

## 2017-05-09 DIAGNOSIS — I2510 Atherosclerotic heart disease of native coronary artery without angina pectoris: Secondary | ICD-10-CM | POA: Insufficient documentation

## 2017-05-09 DIAGNOSIS — J439 Emphysema, unspecified: Secondary | ICD-10-CM | POA: Insufficient documentation

## 2017-05-09 DIAGNOSIS — C3412 Malignant neoplasm of upper lobe, left bronchus or lung: Secondary | ICD-10-CM | POA: Insufficient documentation

## 2017-05-09 LAB — POCT I-STAT CREATININE: CREATININE: 0.6 mg/dL (ref 0.44–1.00)

## 2017-05-09 MED ORDER — IOPAMIDOL (ISOVUE-300) INJECTION 61%
75.0000 mL | Freq: Once | INTRAVENOUS | Status: AC | PRN
  Administered 2017-05-09: 75 mL via INTRAVENOUS

## 2017-05-11 ENCOUNTER — Inpatient Hospital Stay (HOSPITAL_BASED_OUTPATIENT_CLINIC_OR_DEPARTMENT_OTHER): Payer: Medicare Other | Admitting: Internal Medicine

## 2017-05-11 ENCOUNTER — Inpatient Hospital Stay: Payer: Medicare Other | Attending: Internal Medicine

## 2017-05-11 VITALS — BP 131/81 | HR 87 | Temp 97.4°F | Resp 16

## 2017-05-11 DIAGNOSIS — D751 Secondary polycythemia: Secondary | ICD-10-CM | POA: Diagnosis not present

## 2017-05-11 DIAGNOSIS — C3412 Malignant neoplasm of upper lobe, left bronchus or lung: Secondary | ICD-10-CM

## 2017-05-11 DIAGNOSIS — I700 Atherosclerosis of aorta: Secondary | ICD-10-CM

## 2017-05-11 DIAGNOSIS — I10 Essential (primary) hypertension: Secondary | ICD-10-CM | POA: Diagnosis not present

## 2017-05-11 DIAGNOSIS — Z79899 Other long term (current) drug therapy: Secondary | ICD-10-CM | POA: Diagnosis not present

## 2017-05-11 DIAGNOSIS — E785 Hyperlipidemia, unspecified: Secondary | ICD-10-CM

## 2017-05-11 DIAGNOSIS — F17210 Nicotine dependence, cigarettes, uncomplicated: Secondary | ICD-10-CM | POA: Diagnosis not present

## 2017-05-11 DIAGNOSIS — Z8781 Personal history of (healed) traumatic fracture: Secondary | ICD-10-CM

## 2017-05-11 DIAGNOSIS — I2510 Atherosclerotic heart disease of native coronary artery without angina pectoris: Secondary | ICD-10-CM | POA: Insufficient documentation

## 2017-05-11 DIAGNOSIS — E119 Type 2 diabetes mellitus without complications: Secondary | ICD-10-CM

## 2017-05-11 DIAGNOSIS — E871 Hypo-osmolality and hyponatremia: Secondary | ICD-10-CM

## 2017-05-11 DIAGNOSIS — Z7982 Long term (current) use of aspirin: Secondary | ICD-10-CM

## 2017-05-11 DIAGNOSIS — Z809 Family history of malignant neoplasm, unspecified: Secondary | ICD-10-CM

## 2017-05-11 DIAGNOSIS — M069 Rheumatoid arthritis, unspecified: Secondary | ICD-10-CM | POA: Diagnosis not present

## 2017-05-11 DIAGNOSIS — M810 Age-related osteoporosis without current pathological fracture: Secondary | ICD-10-CM | POA: Insufficient documentation

## 2017-05-11 DIAGNOSIS — Z923 Personal history of irradiation: Secondary | ICD-10-CM

## 2017-05-11 DIAGNOSIS — R918 Other nonspecific abnormal finding of lung field: Secondary | ICD-10-CM

## 2017-05-11 DIAGNOSIS — K589 Irritable bowel syndrome without diarrhea: Secondary | ICD-10-CM | POA: Insufficient documentation

## 2017-05-11 DIAGNOSIS — J449 Chronic obstructive pulmonary disease, unspecified: Secondary | ICD-10-CM | POA: Insufficient documentation

## 2017-05-11 DIAGNOSIS — Z801 Family history of malignant neoplasm of trachea, bronchus and lung: Secondary | ICD-10-CM

## 2017-05-11 LAB — COMPREHENSIVE METABOLIC PANEL
ALBUMIN: 3.6 g/dL (ref 3.5–5.0)
ALK PHOS: 79 U/L (ref 38–126)
ALT: 10 U/L — AB (ref 14–54)
AST: 21 U/L (ref 15–41)
Anion gap: 13 (ref 5–15)
BILIRUBIN TOTAL: 0.5 mg/dL (ref 0.3–1.2)
BUN: 8 mg/dL (ref 6–20)
CALCIUM: 9.2 mg/dL (ref 8.9–10.3)
CO2: 25 mmol/L (ref 22–32)
CREATININE: 0.59 mg/dL (ref 0.44–1.00)
Chloride: 89 mmol/L — ABNORMAL LOW (ref 101–111)
GFR calc Af Amer: >60 mL/min (ref >60)
GLUCOSE: 143 mg/dL — AB (ref 65–99)
POTASSIUM: 4.1 mmol/L (ref 3.5–5.1)
Sodium: 127 mmol/L — ABNORMAL LOW (ref 135–145)
TOTAL PROTEIN: 7.8 g/dL (ref 6.5–8.1)

## 2017-05-11 LAB — CBC WITH DIFFERENTIAL/PLATELET
BASOS ABS: 0 10*3/uL (ref 0–0.1)
BASOS PCT: 1 %
EOS ABS: 0 10*3/uL (ref 0–0.7)
Eosinophils Relative: 0 %
HEMATOCRIT: 41.8 % (ref 35.0–47.0)
HEMOGLOBIN: 14.9 g/dL (ref 12.0–16.0)
Lymphocytes Relative: 15 %
Lymphs Abs: 1.1 10*3/uL (ref 1.0–3.6)
MCH: 39 pg — ABNORMAL HIGH (ref 26.0–34.0)
MCHC: 35.5 g/dL (ref 32.0–36.0)
MCV: 109.9 fL — ABNORMAL HIGH (ref 80.0–100.0)
Monocytes Absolute: 0.5 10*3/uL (ref 0.2–0.9)
Monocytes Relative: 6 %
NEUTROS ABS: 5.8 10*3/uL (ref 1.4–6.5)
NEUTROS PCT: 78 %
Platelets: 134 10*3/uL — ABNORMAL LOW (ref 150–440)
RBC: 3.81 MIL/uL (ref 3.80–5.20)
RDW: 13.8 % (ref 11.5–14.5)
WBC: 7.4 10*3/uL (ref 3.6–11.0)

## 2017-05-11 NOTE — Progress Notes (Signed)
[facility] OFFICE PROGRESS NOTE  Patient Care Team: Adin Hector, MD as PCP - General (Internal Medicine)   SUMMARY OF ONCOLOGIC HISTORY:  Oncology History   # NOV 2017- LUL MASS PET- SUV 12; Pt DECLINED BIOPSY. DEC 1st 2017- RT [Dr.Crystal];CT May 23rd 2018- PR  # ERYTHROCYTOSIS- likely sec to smoking [Jak-2/exon 12- NEG]  # Hx of smoking- does not want to quit.; RA- [? Dr.Kernodle]..      Malignant neoplasm of upper lobe of left lung ([place])     INTERVAL HISTORY:  A very pleasant 71 year old female patient with long-standing history of smoking- Left upper lobe mass- highly concerning for malignancy s/p radiation on feb 12th; Patient is here to review the results of the restaging CAT scan.  Patient continues to complain of chronic joint pains. Has chronic shortness of breath. Otherwise denies any hemoptysis. Complains of chronic cough-not any worse.  No headaches. Denies any leg swelling. Unfortunately she continues to smoke. Drinks wine/alcohol every day.   REVIEW OF SYSTEMS:  A complete 10 point review of system is done which is negative except mentioned above/history of present illness.   PAST MEDICAL HISTORY :  Past Medical History:  Diagnosis Date  . Aortic atherosclerosis ([place])   . Arrhythmia   . COPD (chronic obstructive pulmonary disease) ([place])   . Diabetes mellitus type 2, uncomplicated ([place])   . Essential hypertension   . History of pelvic fracture   . Hyperlipidemia, unspecified   . IBS (irritable bowel syndrome)   . Lung cancer ([place])   . Lung mass   . Osteoporosis   . Rheumatoid arthritis involving multiple sites with positive rheumatoid factor ([place])   . Secondary polycythemia   ; HTN; COPD  PAST SURGICAL HISTORY :   Past Surgical History:  Procedure Laterality Date  . BREAST BIOPSY Bilateral 1970   negative  . COLONOSCOPY     03/30/2002, 12/25/2004, 03/17/2007, 05/08/2010  . Resection of chronic olecranon bursitis    . S/P oral  surgery      FAMILY HISTORY :   Family History  Problem Relation Age of Onset  . Lung cancer Brother   . Bone cancer Brother   . Breast cancer Neg Hx     SOCIAL HISTORY:  History of smoking; denies alcohol abuse.  Social History  Substance Use Topics  . Smoking status: Current Some Day Smoker    Packs/day: 0.50    Years: 55.00  . Smokeless tobacco: Never Used  . Alcohol use 8.4 oz/week    14 Glasses of wine per week     Comment: 1-3 glasses of wine a day    ALLERGIES:  is allergic to augmentin [amoxicillin-pot clavulanate]; penicillins; ace inhibitors; actonel [risedronate sodium]; celexa [citalopram hydrobromide]; ciprofloxacin; dicyclomine hcl; hydrochlorothiazide w-triamterene; maxzide [triamterene-hctz]; nicoderm [nicotine]; nsaids; oxycodone; and zyban [bupropion].  MEDICATIONS:  Current Outpatient Prescriptions  Medication Sig Dispense Refill  . ADVAIR DISKUS 500-50 MCG/DOSE AEPB   1  . albuterol (PROAIR HFA) 108 (90 Base) MCG/ACT inhaler Inhale 2 puffs into the lungs every 6 (six) hours as needed for wheezing or shortness of breath.    Marland Kitchen amLODipine (NORVASC) 2.5 MG tablet Take 2.5 mg by mouth daily.    Marland Kitchen aspirin 81 MG tablet Take 81 mg by mouth daily.    Marland Kitchen atenolol (TENORMIN) 100 MG tablet Take 100 mg by mouth daily.   0  . Cholecalciferol 1000 units tablet Take 1,000 Units by mouth daily.    Marland Kitchen  clonazePAM (KLONOPIN) 0.5 MG tablet Take 0.5 mg by mouth 2 (two) times daily as needed for anxiety.    . fluticasone (FLONASE) 50 MCG/ACT nasal spray Place into both nostrils daily as needed.     Marland Kitchen omeprazole (PRILOSEC) 20 MG capsule Take 20 mg by mouth daily as needed.     Marland Kitchen QUEtiapine (SEROQUEL) 25 MG tablet Take 25 mg by mouth at bedtime.  0  . ondansetron (ZOFRAN) 8 MG tablet Take 1 tablet (8 mg total) by mouth every 8 (eight) hours as needed for nausea or vomiting. (Patient not taking: Reported on 05/11/2017) 20 tablet 2   No current facility-administered medications for this  visit.     PHYSICAL EXAMINATION:   BP 131/81 (BP Location: Left Arm, Patient Position: Sitting)   Pulse 87   Temp (!) 97.4 F (36.3 C) (Tympanic)   Resp 16   There were no vitals filed for this visit.  GENERAL: Well-nourished well-developed; Alert, no distress and comfortable.  Alone. EYES: no pallor or icterus OROPHARYNX: no thrush or ulceration; dentures.  NECK: supple, no masses felt LYMPH:  no palpable lymphadenopathy in the cervical, axillary or inguinal regions LUNGS: Decreased breath sounds to auscultation and  Scattered wheezing HEART/CVS: regular rate & rhythm and no murmurs; No lower extremity edema ABDOMEN:abdomen soft, non-tender and normal bowel sounds Musculoskeletal:no cyanosis of digits and no clubbing  PSYCH: alert & oriented x 3 with fluent speech NEURO: no focal motor/sensory deficits SKIN:  Rash noted in radiation portal.   LABORATORY DATA:  I have reviewed the data as listed    Component Value Date/Time   NA 127 (L) 05/11/2017 1435   K 4.1 05/11/2017 1435   CL 89 (L) 05/11/2017 1435   CO2 25 05/11/2017 1435   GLUCOSE 143 (H) 05/11/2017 1435   BUN 8 05/11/2017 1435   CREATININE 0.59 05/11/2017 1435   CREATININE 0.77 01/21/2014 1031   CALCIUM 9.2 05/11/2017 1435   PROT 7.8 05/11/2017 1435   PROT 8.4 (H) 01/21/2014 1031   ALBUMIN 3.6 05/11/2017 1435   ALBUMIN 3.8 01/21/2014 1031   AST 21 05/11/2017 1435   AST 17 01/21/2014 1031   ALT 10 (L) 05/11/2017 1435   ALT 18 01/21/2014 1031   ALKPHOS 79 05/11/2017 1435   ALKPHOS 82 01/21/2014 1031   BILITOT 0.5 05/11/2017 1435   BILITOT 0.4 01/21/2014 1031   GFRNONAA >60 05/11/2017 1435   GFRNONAA >60 01/21/2014 1031   GFRAA >60 05/11/2017 1435   GFRAA >60 01/21/2014 1031    No results found for: SPEP, UPEP  Lab Results  Component Value Date   WBC 7.4 05/11/2017   NEUTROABS 5.8 05/11/2017   HGB 14.9 05/11/2017   HCT 41.8 05/11/2017   MCV 109.9 (H) 05/11/2017   PLT 134 (L) 05/11/2017       Chemistry      Component Value Date/Time   NA 127 (L) 05/11/2017 1435   K 4.1 05/11/2017 1435   CL 89 (L) 05/11/2017 1435   CO2 25 05/11/2017 1435   BUN 8 05/11/2017 1435   CREATININE 0.59 05/11/2017 1435   CREATININE 0.77 01/21/2014 1031      Component Value Date/Time   CALCIUM 9.2 05/11/2017 1435   ALKPHOS 79 05/11/2017 1435   ALKPHOS 82 01/21/2014 1031   AST 21 05/11/2017 1435   AST 17 01/21/2014 1031   ALT 10 (L) 05/11/2017 1435   ALT 18 01/21/2014 1031   BILITOT 0.5 05/11/2017 1435   BILITOT 0.4 01/21/2014  1031    IMPRESSION: 1. Enlarging left upper lobe mass with increasing postobstructive changes in the medial aspect of the left upper lobe extending to the left apex, as above. No mediastinal or hilar lymphadenopathy. No new pulmonary nodules. 2. Mild diffuse bronchial wall thickening with mild centrilobular and paraseptal emphysema. 3. Aortic atherosclerosis, in addition to left main and 3 vessel coronary artery disease. Assessment for potential risk factor modification, dietary therapy or pharmacologic therapy may be warranted, if clinically indicated.  Aortic Atherosclerosis (ICD10-I70.0) and Emphysema (ICD10-J43.9).   Electronically Signed   By: Vinnie Langton M.D.   On: 05/09/2017 16:17  ASSESSMENT & PLAN:   Malignant neoplasm of upper lobe of left lung ([place]) # LUL mass- highly suspicious for malignancy. Patient declined biopsy- currently s/p  RT [last feb 12th]. SEP 2018 CT scan shows increasing size of the left upper lobe lesion- left suprahilar lesion currently measuring about 2.3x2.5x5.8cm- associated with atelectasis/radiation changes. This is concerning for recurrence. However would recommend a PET scan for further evaluation. We will also review the tumor conference.  # Hyponatremia chronic 127 today- question related to above malignancy. Recommend  Free water restriction.  # COPD- Stable. continue proair q 4-6 hours; continue advair. Stable.    # Active smoker- patient is not interested in smoking cessation.  # follow up with me few days after the PET scan. Will discuss with Dr.Crystal.      Cammie Sickle, MD 05/11/2017 7:33 PM

## 2017-05-11 NOTE — Assessment & Plan Note (Addendum)
#   LUL mass- highly suspicious for malignancy. Patient declined biopsy- currently s/p  RT [last feb 12th]. SEP 2018 CT scan shows increasing size of the left upper lobe lesion- left suprahilar lesion currently measuring about 2.3x2.5x5.8cm- associated with atelectasis/radiation changes. This is concerning for recurrence. However would recommend a PET scan for further evaluation. We will also review the tumor conference.  # Hyponatremia chronic 127 today- question related to above malignancy. Recommend  Free water restriction.  # COPD- Stable. continue proair q 4-6 hours; continue advair. Stable.   # Active smoker- patient is not interested in smoking cessation.  # follow up with me few days after the PET scan. Will discuss with Dr.Crystal.

## 2017-05-13 ENCOUNTER — Ambulatory Visit
Admission: RE | Admit: 2017-05-13 | Discharge: 2017-05-13 | Disposition: A | Discharge status: Home / Self Care | Payer: Medicare Other | Source: Ambulatory Visit | Attending: Internal Medicine | Admitting: Internal Medicine

## 2017-05-13 DIAGNOSIS — C3412 Malignant neoplasm of upper lobe, left bronchus or lung: Secondary | ICD-10-CM | POA: Diagnosis not present

## 2017-05-13 LAB — GLUCOSE, CAPILLARY: Glucose-Capillary: 146 mg/dL — ABNORMAL HIGH (ref 65–99)

## 2017-05-13 MED ORDER — FLUDEOXYGLUCOSE F - 18 (FDG) INJECTION
12.0000 | Freq: Once | INTRAVENOUS | Status: AC
  Administered 2017-05-13: 13.33 via INTRAVENOUS

## 2017-05-18 ENCOUNTER — Inpatient Hospital Stay: Payer: Medicare Other | Attending: Internal Medicine | Admitting: Internal Medicine

## 2017-05-18 VITALS — BP 144/84 | HR 87 | Temp 96.4°F | Resp 16 | Wt 128.0 lb

## 2017-05-18 DIAGNOSIS — Z79899 Other long term (current) drug therapy: Secondary | ICD-10-CM | POA: Insufficient documentation

## 2017-05-18 DIAGNOSIS — E119 Type 2 diabetes mellitus without complications: Secondary | ICD-10-CM | POA: Insufficient documentation

## 2017-05-18 DIAGNOSIS — Z7982 Long term (current) use of aspirin: Secondary | ICD-10-CM | POA: Insufficient documentation

## 2017-05-18 DIAGNOSIS — M2550 Pain in unspecified joint: Secondary | ICD-10-CM | POA: Diagnosis not present

## 2017-05-18 DIAGNOSIS — C3412 Malignant neoplasm of upper lobe, left bronchus or lung: Secondary | ICD-10-CM | POA: Insufficient documentation

## 2017-05-18 DIAGNOSIS — Z801 Family history of malignant neoplasm of trachea, bronchus and lung: Secondary | ICD-10-CM | POA: Insufficient documentation

## 2017-05-18 DIAGNOSIS — J449 Chronic obstructive pulmonary disease, unspecified: Secondary | ICD-10-CM | POA: Insufficient documentation

## 2017-05-18 DIAGNOSIS — I10 Essential (primary) hypertension: Secondary | ICD-10-CM | POA: Insufficient documentation

## 2017-05-18 DIAGNOSIS — D751 Secondary polycythemia: Secondary | ICD-10-CM | POA: Diagnosis not present

## 2017-05-18 DIAGNOSIS — M069 Rheumatoid arthritis, unspecified: Secondary | ICD-10-CM | POA: Insufficient documentation

## 2017-05-18 DIAGNOSIS — K589 Irritable bowel syndrome without diarrhea: Secondary | ICD-10-CM | POA: Insufficient documentation

## 2017-05-18 DIAGNOSIS — E785 Hyperlipidemia, unspecified: Secondary | ICD-10-CM | POA: Insufficient documentation

## 2017-05-18 DIAGNOSIS — I2510 Atherosclerotic heart disease of native coronary artery without angina pectoris: Secondary | ICD-10-CM | POA: Insufficient documentation

## 2017-05-18 DIAGNOSIS — Z808 Family history of malignant neoplasm of other organs or systems: Secondary | ICD-10-CM | POA: Diagnosis not present

## 2017-05-18 DIAGNOSIS — M810 Age-related osteoporosis without current pathological fracture: Secondary | ICD-10-CM | POA: Insufficient documentation

## 2017-05-18 DIAGNOSIS — E871 Hypo-osmolality and hyponatremia: Secondary | ICD-10-CM | POA: Diagnosis not present

## 2017-05-18 DIAGNOSIS — F17210 Nicotine dependence, cigarettes, uncomplicated: Secondary | ICD-10-CM | POA: Insufficient documentation

## 2017-05-18 NOTE — Assessment & Plan Note (Addendum)
#   LUL mass- highly suspicious for malignancy. Patient declined biopsy- currently s/p  RT [last feb 12th]. SEP 2018 CT scan shows increasing size of the left upper lobe lesion- left suprahilar lesion currently measuring about 2.3x2.5x5.8cm- associated with atelectasis/radiation changes. HOW EVER-SEP 27th 2018 PET- STABLE; slightly decreased SUV. Reviewed images with Dr.Crystal.   # Patient continues to decline biopsy. On further systemic therapy options with her biopsy. Patient agreeable for follow-up CT scan in 4 months.  # chronic Hyponatremia chronic 127 today- question related to above malignancy. Recommend  Free water restriction.; increase salt intake. Will repeat bmp in 1 month.   # COPD- Stable. continue proair q 4-6 hours; continue advair. Stable.   # Active smoker- patient is not interested in smoking cessation.  # follow up in 4 months;labs;  CT scan prior. Labs in 1 month re: low sodium.  # I reviewed the blood work- with the patient in detail; also reviewed the imaging independently [as summarized above]; and with the patient in detail.   # 25 minutes face-to-face with the patient discussing the above plan of care; more than 50% of time spent on prognosis/ natural history; counseling and coordination.   Cc; Dr.Klein

## 2017-05-18 NOTE — Progress Notes (Signed)
[facility] OFFICE PROGRESS NOTE  Patient Care Team: Adin Hector, MD as PCP - General (Internal Medicine)   SUMMARY OF ONCOLOGIC HISTORY:  Oncology History   # NOV 2017- LUL MASS PET- SUV 12; Pt DECLINED BIOPSY. DEC 1st 2017- RT [Dr.Crystal];CT May 23rd 2018- PR  # ERYTHROCYTOSIS- likely sec to smoking [Jak-2/exon 12- NEG]  # Hx of smoking- does not want to quit.; RA- [? Dr.Kernodle]..      Malignant neoplasm of upper lobe of left lung ([place])     INTERVAL HISTORY:  A very pleasant 71 year old female patient with long-standing history of smoking- Left upper lobe mass- highly concerning for malignancy s/p radiation on feb 12th 2018; is here to review the results of the PET scan.  Patient had a CT scan in September 2018 that showed slight increase in size of the left hilar lesion.  Patient unfortunately continues to smoke. She continues to chronic shortness of breath/from a COPD. No hemoptysis. No new cough. Has chronic joint pains.  No headaches. Denies any leg swelling. Drinks wine/alcohol every day.   REVIEW OF SYSTEMS:  A complete 10 point review of system is done which is negative except mentioned above/history of present illness.   PAST MEDICAL HISTORY :  Past Medical History:  Diagnosis Date  . Aortic atherosclerosis ([place])   . Arrhythmia   . COPD (chronic obstructive pulmonary disease) ([place])   . Diabetes mellitus type 2, uncomplicated ([place])   . Essential hypertension   . History of pelvic fracture   . Hyperlipidemia, unspecified   . IBS (irritable bowel syndrome)   . Lung cancer ([place])   . Lung mass   . Osteoporosis   . Rheumatoid arthritis involving multiple sites with positive rheumatoid factor ([place])   . Secondary polycythemia   ; HTN; COPD  PAST SURGICAL HISTORY :   Past Surgical History:  Procedure Laterality Date  . BREAST BIOPSY Bilateral 1970   negative  . COLONOSCOPY     03/30/2002, 12/25/2004, 03/17/2007, 05/08/2010  .  Resection of chronic olecranon bursitis    . S/P oral surgery      FAMILY HISTORY :   Family History  Problem Relation Age of Onset  . Lung cancer Brother   . Bone cancer Brother   . Breast cancer Neg Hx     SOCIAL HISTORY:  History of smoking; denies alcohol abuse.  Social History  Substance Use Topics  . Smoking status: Current Some Day Smoker    Packs/day: 0.50    Years: 55.00  . Smokeless tobacco: Never Used  . Alcohol use 8.4 oz/week    14 Glasses of wine per week     Comment: 1-3 glasses of wine a day    ALLERGIES:  is allergic to augmentin [amoxicillin-pot clavulanate]; penicillins; ace inhibitors; actonel [risedronate sodium]; celexa [citalopram hydrobromide]; ciprofloxacin; dicyclomine hcl; hydrochlorothiazide w-triamterene; maxzide [triamterene-hctz]; nicoderm [nicotine]; nsaids; oxycodone; and zyban [bupropion].  MEDICATIONS:  Current Outpatient Prescriptions  Medication Sig Dispense Refill  . ADVAIR DISKUS 500-50 MCG/DOSE AEPB   1  . albuterol (PROAIR HFA) 108 (90 Base) MCG/ACT inhaler Inhale 2 puffs into the lungs every 6 (six) hours as needed for wheezing or shortness of breath.    Marland Kitchen amLODipine (NORVASC) 2.5 MG tablet Take 2.5 mg by mouth daily.    Marland Kitchen aspirin 81 MG tablet Take 81 mg by mouth daily.    Marland Kitchen atenolol (TENORMIN) 100 MG tablet Take 100 mg by mouth daily.   0  .  Cholecalciferol 1000 units tablet Take 1,000 Units by mouth daily.    . clonazePAM (KLONOPIN) 0.5 MG tablet Take 0.5 mg by mouth 2 (two) times daily as needed for anxiety.    . fluticasone (FLONASE) 50 MCG/ACT nasal spray Place into both nostrils daily as needed.     Marland Kitchen omeprazole (PRILOSEC) 20 MG capsule Take 20 mg by mouth daily as needed.     . ondansetron (ZOFRAN) 8 MG tablet Take 1 tablet (8 mg total) by mouth every 8 (eight) hours as needed for nausea or vomiting. 20 tablet 2  . QUEtiapine (SEROQUEL) 25 MG tablet Take 25 mg by mouth at bedtime.  0   No current facility-administered  medications for this visit.     PHYSICAL EXAMINATION:   BP (!) 144/84 (BP Location: Left Arm, Patient Position: Sitting)   Pulse 87   Temp (!) 96.4 F (35.8 C) (Tympanic)   Resp 16   Wt 128 lb (58.1 kg)   BMI 22.67 kg/m   Filed Weights   05/18/17 1047  Weight: 128 lb (58.1 kg)    GENERAL: Well-nourished well-developed; Alert, no distress and comfortable.  Alone. EYES: no pallor or icterus OROPHARYNX: no thrush or ulceration; dentures.  NECK: supple, no masses felt LYMPH:  no palpable lymphadenopathy in the cervical, axillary or inguinal regions LUNGS: Decreased breath sounds to auscultation and  Scattered wheezing HEART/CVS: regular rate & rhythm and no murmurs; No lower extremity edema ABDOMEN:abdomen soft, non-tender and normal bowel sounds Musculoskeletal:no cyanosis of digits and no clubbing  PSYCH: alert & oriented x 3 with fluent speech NEURO: no focal motor/sensory deficits SKIN:  Rash noted in radiation portal.   LABORATORY DATA:  I have reviewed the data as listed    Component Value Date/Time   NA 127 (L) 05/11/2017 1435   K 4.1 05/11/2017 1435   CL 89 (L) 05/11/2017 1435   CO2 25 05/11/2017 1435   GLUCOSE 143 (H) 05/11/2017 1435   BUN 8 05/11/2017 1435   CREATININE 0.59 05/11/2017 1435   CREATININE 0.77 01/21/2014 1031   CALCIUM 9.2 05/11/2017 1435   PROT 7.8 05/11/2017 1435   PROT 8.4 (H) 01/21/2014 1031   ALBUMIN 3.6 05/11/2017 1435   ALBUMIN 3.8 01/21/2014 1031   AST 21 05/11/2017 1435   AST 17 01/21/2014 1031   ALT 10 (L) 05/11/2017 1435   ALT 18 01/21/2014 1031   ALKPHOS 79 05/11/2017 1435   ALKPHOS 82 01/21/2014 1031   BILITOT 0.5 05/11/2017 1435   BILITOT 0.4 01/21/2014 1031   GFRNONAA >60 05/11/2017 1435   GFRNONAA >60 01/21/2014 1031   GFRAA >60 05/11/2017 1435   GFRAA >60 01/21/2014 1031    No results found for: SPEP, UPEP  Lab Results  Component Value Date   WBC 7.4 05/11/2017   NEUTROABS 5.8 05/11/2017   HGB 14.9 05/11/2017    HCT 41.8 05/11/2017   MCV 109.9 (H) 05/11/2017   PLT 134 (L) 05/11/2017      Chemistry      Component Value Date/Time   NA 127 (L) 05/11/2017 1435   K 4.1 05/11/2017 1435   CL 89 (L) 05/11/2017 1435   CO2 25 05/11/2017 1435   BUN 8 05/11/2017 1435   CREATININE 0.59 05/11/2017 1435   CREATININE 0.77 01/21/2014 1031      Component Value Date/Time   CALCIUM 9.2 05/11/2017 1435   ALKPHOS 79 05/11/2017 1435   ALKPHOS 82 01/21/2014 1031   AST 21 05/11/2017 1435   AST  17 01/21/2014 1031   ALT 10 (L) 05/11/2017 1435   ALT 18 01/21/2014 1031   BILITOT 0.5 05/11/2017 1435   BILITOT 0.4 01/21/2014 1031    IMPRESSION: 1. Enlarging left upper lobe mass with increasing postobstructive changes in the medial aspect of the left upper lobe extending to the left apex, as above. No mediastinal or hilar lymphadenopathy. No new pulmonary nodules. 2. Mild diffuse bronchial wall thickening with mild centrilobular and paraseptal emphysema. 3. Aortic atherosclerosis, in addition to left main and 3 vessel coronary artery disease. Assessment for potential risk factor modification, dietary therapy or pharmacologic therapy may be warranted, if clinically indicated.  Aortic Atherosclerosis (ICD10-I70.0) and Emphysema (ICD10-J43.9).   Electronically Signed   By: Vinnie Langton M.D.   On: 05/09/2017 16:1  -----------------------------------------  IMPRESSION: 1. Persistent hypermetabolic lung mass compatible with residual/recurrence of tumor. This is not significantly changed in size compared with PET-CT from 06/18/2016. However, the degree of FDG uptake is slightly decreased in the interval. 2. Posttraumatic uptake localizing to subacute fracture involving the left anterior rib. 3. Indeterminate focus within the right posterior chest wall between the scapula and rib. This may also be posttraumatic in etiology. Attention in this area on follow-up imaging advise.   Electronically  Signed   By: Kerby Moors M.D.   On: 05/13/2017 12:38 ASSESSMENT & PLAN:   Malignant neoplasm of upper lobe of left lung ([place]) # LUL mass- highly suspicious for malignancy. Patient declined biopsy- currently s/p  RT [last feb 12th]. SEP 2018 CT scan shows increasing size of the left upper lobe lesion- left suprahilar lesion currently measuring about 2.3x2.5x5.8cm- associated with atelectasis/radiation changes. HOW EVER-SEP 27th 2018 PET- STABLE; slightly decreased SUV. Reviewed images with Dr.Crystal.   # Patient continues to decline biopsy. On further systemic therapy options with her biopsy. Patient agreeable for follow-up CT scan in 4 months.  # chronic Hyponatremia chronic 127 today- question related to above malignancy. Recommend  Free water restriction.; increase salt intake. Will repeat bmp in 1 month.   # COPD- Stable. continue proair q 4-6 hours; continue advair. Stable.   # Active smoker- patient is not interested in smoking cessation.  # follow up in 4 months;labs;  CT scan prior. Labs in 1 month re: low sodium.  # I reviewed the blood work- with the patient in detail; also reviewed the imaging independently [as summarized above]; and with the patient in detail.   # 25 minutes face-to-face with the patient discussing the above plan of care; more than 50% of time spent on prognosis/ natural history; counseling and coordination.   Cc; Dr.Klein     Cammie Sickle, MD 05/18/2017 11:08 AM

## 2017-06-22 ENCOUNTER — Inpatient Hospital Stay: Payer: Medicare Other | Attending: Internal Medicine

## 2017-06-22 DIAGNOSIS — C3412 Malignant neoplasm of upper lobe, left bronchus or lung: Secondary | ICD-10-CM

## 2017-06-22 LAB — BASIC METABOLIC PANEL
ANION GAP: 10 (ref 5–15)
BUN: 7 mg/dL (ref 6–20)
CALCIUM: 9 mg/dL (ref 8.9–10.3)
CHLORIDE: 90 mmol/L — AB (ref 101–111)
CO2: 28 mmol/L (ref 22–32)
Creatinine, Ser: 0.54 mg/dL (ref 0.44–1.00)
GFR calc non Af Amer: >60 mL/min (ref >60)
GLUCOSE: 189 mg/dL — AB (ref 65–99)
POTASSIUM: 4.2 mmol/L (ref 3.5–5.1)
Sodium: 128 mmol/L — ABNORMAL LOW (ref 135–145)

## 2017-08-11 ENCOUNTER — Telehealth: Payer: Self-pay | Admitting: Internal Medicine

## 2017-08-11 NOTE — Telephone Encounter (Signed)
Patient states she is not seeing Dr. Mortimer Fries anymore   Deleting recall

## 2017-09-15 ENCOUNTER — Encounter: Payer: Self-pay | Admitting: Radiation Oncology

## 2017-09-15 ENCOUNTER — Other Ambulatory Visit: Payer: Self-pay

## 2017-09-15 ENCOUNTER — Ambulatory Visit
Admission: RE | Admit: 2017-09-15 | Discharge: 2017-09-15 | Disposition: A | Discharge status: Home / Self Care | Payer: Medicare Other | Source: Ambulatory Visit | Attending: Radiation Oncology | Admitting: Radiation Oncology

## 2017-09-15 VITALS — BP 138/86 | HR 105 | Temp 96.3°F | Resp 18 | Wt 118.2 lb

## 2017-09-15 DIAGNOSIS — F17210 Nicotine dependence, cigarettes, uncomplicated: Secondary | ICD-10-CM | POA: Insufficient documentation

## 2017-09-15 DIAGNOSIS — Z9221 Personal history of antineoplastic chemotherapy: Secondary | ICD-10-CM | POA: Insufficient documentation

## 2017-09-15 DIAGNOSIS — C3402 Malignant neoplasm of left main bronchus: Secondary | ICD-10-CM | POA: Insufficient documentation

## 2017-09-15 DIAGNOSIS — R918 Other nonspecific abnormal finding of lung field: Secondary | ICD-10-CM

## 2017-09-15 DIAGNOSIS — Z923 Personal history of irradiation: Secondary | ICD-10-CM | POA: Insufficient documentation

## 2017-09-15 NOTE — Progress Notes (Signed)
Radiation Oncology Follow up Note  Name: Madeline Schmitt   Date:   09/15/2017 MRN:  [id number] DOB: [birth year]    This 72 y.o. 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  She is seen today in routine follow-up and is doing well.  She continues to smoke.  She specifically denies cough hemoptysis or chest tightness.  We are following a lesion on her PET/CT scan which is hypermetabolic.  Consistent with residual or recurrent disease.  This however has not changed dramatically since November 2017 and the F DG uptake is slightly decreased in the interval. She is scheduled for follow-up CT scan in the next 2 weeks. COMPLICATIONS OF TREATMENT: none  FOLLOW UP COMPLIANCE: keeps appointments   PHYSICAL EXAM:  BP 138/86   Pulse (!) 105   Temp (!) 96.3 F (35.7 C)   Resp 18   Wt 118 lb 2.7 oz (53.6 kg)   BMI 20.93 kg/m  Well-developed well-nourished patient in NAD. HEENT reveals PERLA, EOMI, discs not visualized.  Oral cavity is clear. No oral mucosal lesions are identified. Neck is clear without evidence of cervical or supraclavicular adenopathy. Lungs are clear to A&P. Cardiac examination is essentially unremarkable with regular rate and rhythm without murmur rub or thrill. Abdomen is benign with no organomegaly or masses noted. Motor sensory and DTR levels are equal and symmetric in the upper and lower extremities. Cranial nerves II through XII are grossly intact. Proprioception is intact. No peripheral adenopathy or edema is identified. No motor or sensory levels are noted. Crude visual fields are within normal range.  RADIOLOGY RESULTS: PET/CT scans are reviewed  in a serial fashion and compatible with the above-stated findings.  PLAN: Patient is clinically stable.  Certainly concerned this is persistent disease in her left hilum although based on this remarkably slow growth over time we will continue to observe at this time.  Patient continues to decline biopsy.  She will have a CT scan in the next 2 weeks have asked her to copy me on that and I will review with medical oncology when it is available.  I have otherwise asked to see her back in 6 months for follow-up.  Patient knows to call sooner with any concerns.  I would like to take this opportunity to thank you for allowing me to participate in the care of your patient.Noreene Filbert, MD

## 2017-09-20 ENCOUNTER — Ambulatory Visit: Admission: RE | Admit: 2017-09-20 | Payer: Medicare Other | Source: Ambulatory Visit

## 2017-09-21 ENCOUNTER — Inpatient Hospital Stay: Payer: Medicare Other | Admitting: Internal Medicine

## 2017-09-21 ENCOUNTER — Inpatient Hospital Stay: Payer: Medicare Other

## 2017-09-21 NOTE — Assessment & Plan Note (Deleted)
#   LUL mass- highly suspicious for malignancy. Patient declined biopsy- currently s/p  RT [last feb 12th]. SEP 2018 CT scan shows increasing size of the left upper lobe lesion- left suprahilar lesion currently measuring about 2.3x2.5x5.8cm- associated with atelectasis/radiation changes. HOW EVER-SEP 27th 2018 PET- STABLE; slightly decreased SUV. Reviewed images with Dr.Crystal.   # Patient continues to decline biopsy. On further systemic therapy options with her biopsy. Patient agreeable for follow-up CT scan in 4 months.  # chronic Hyponatremia chronic 127 today- question related to above malignancy. Recommend  Free water restriction.; increase salt intake. Will repeat bmp in 1 month.   # COPD- Stable. continue proair q 4-6 hours; continue advair. Stable.   # Active smoker- patient is not interested in smoking cessation.  # follow up in 4 months;labs;  CT scan prior. Labs in 1 month re: low sodium.  # I reviewed the blood work- with the patient in detail; also reviewed the imaging independently [as summarized above]; and with the patient in detail.   # 25 minutes face-to-face with the patient discussing the above plan of care; more than 50% of time spent on prognosis/ natural history; counseling and coordination.   Cc; Dr.Klein

## 2017-09-21 NOTE — Progress Notes (Deleted)
[facility] OFFICE PROGRESS NOTE  Patient Care Team: Adin Hector, MD as PCP - General (Internal Medicine)   SUMMARY OF ONCOLOGIC HISTORY:  Oncology History   # NOV 2017- LUL MASS PET- SUV 12; Pt DECLINED BIOPSY. DEC 1st 2017- RT [Dr.Crystal];CT May 23rd 2018- PR  # ERYTHROCYTOSIS- likely sec to smoking [Jak-2/exon 12- NEG]  # Hx of smoking- does not want to quit.; RA- [? Dr.Kernodle]..      Malignant neoplasm of upper lobe of left lung ([place])     INTERVAL HISTORY:  A very pleasant 72 year old female patient with long-standing history of smoking- Left upper lobe mass- highly concerning for malignancy s/p radiation on feb 12th 2018; is here to review the results of the PET scan.  Patient had a CT scan in September 2018 that showed slight increase in size of the left hilar lesion.  Patient unfortunately continues to smoke. She continues to chronic shortness of breath/from a COPD. No hemoptysis. No new cough. Has chronic joint pains.  No headaches. Denies any leg swelling. Drinks wine/alcohol every day.   REVIEW OF SYSTEMS:  A complete 10 point review of system is done which is negative except mentioned above/history of present illness.   PAST MEDICAL HISTORY :  Past Medical History:  Diagnosis Date  . Aortic atherosclerosis ([place])   . Arrhythmia   . COPD (chronic obstructive pulmonary disease) ([place])   . Diabetes mellitus type 2, uncomplicated ([place])   . Essential hypertension   . History of pelvic fracture   . Hyperlipidemia, unspecified   . IBS (irritable bowel syndrome)   . Lung cancer ([place])   . Lung mass   . Osteoporosis   . Rheumatoid arthritis involving multiple sites with positive rheumatoid factor ([place])   . Secondary polycythemia   ; HTN; COPD  PAST SURGICAL HISTORY :   Past Surgical History:  Procedure Laterality Date  . BREAST BIOPSY Bilateral 1970   negative  . COLONOSCOPY     03/30/2002, 12/25/2004, 03/17/2007, 05/08/2010  .  Resection of chronic olecranon bursitis    . S/P oral surgery      FAMILY HISTORY :   Family History  Problem Relation Age of Onset  . Lung cancer Brother   . Bone cancer Brother   . Breast cancer Neg Hx     SOCIAL HISTORY:  History of smoking; denies alcohol abuse.  Social History   Tobacco Use  . Smoking status: Current Some Day Smoker    Packs/day: 0.50    Years: 55.00    Pack years: 27.50  . Smokeless tobacco: Never Used  Substance Use Topics  . Alcohol use: Yes    Alcohol/week: 8.4 oz    Types: 14 Glasses of wine per week    Comment: 1-3 glasses of wine a day  . Drug use: No    ALLERGIES:  is allergic to augmentin [amoxicillin-pot clavulanate]; penicillins; ace inhibitors; actonel [risedronate sodium]; celexa [citalopram hydrobromide]; ciprofloxacin; dicyclomine hcl; hydrochlorothiazide w-triamterene; maxzide [triamterene-hctz]; nicoderm [nicotine]; nsaids; oxycodone; and zyban [bupropion].  MEDICATIONS:  Current Outpatient Medications  Medication Sig Dispense Refill  . ADVAIR DISKUS 500-50 MCG/DOSE AEPB   1  . albuterol (PROAIR HFA) 108 (90 Base) MCG/ACT inhaler Inhale 2 puffs into the lungs every 6 (six) hours as needed for wheezing or shortness of breath.    Marland Kitchen amLODipine (NORVASC) 2.5 MG tablet Take 2.5 mg by mouth daily.    Marland Kitchen aspirin 81 MG tablet Take 81 mg by mouth daily.    Marland Kitchen  atenolol (TENORMIN) 100 MG tablet Take 100 mg by mouth daily.   0  . Cholecalciferol 1000 units tablet Take 1,000 Units by mouth daily.    . clonazePAM (KLONOPIN) 0.5 MG tablet Take 0.5 mg by mouth 2 (two) times daily as needed for anxiety.    . fluticasone (FLONASE) 50 MCG/ACT nasal spray Place into both nostrils daily as needed.     Marland Kitchen omeprazole (PRILOSEC) 20 MG capsule Take 20 mg by mouth daily as needed.     . ondansetron (ZOFRAN) 8 MG tablet Take 1 tablet (8 mg total) by mouth every 8 (eight) hours as needed for nausea or vomiting. 20 tablet 2  . QUEtiapine (SEROQUEL) 25 MG tablet Take  25 mg by mouth at bedtime.  0   No current facility-administered medications for this visit.     PHYSICAL EXAMINATION:   There were no vitals taken for this visit.  There were no vitals filed for this visit.  GENERAL: Well-nourished well-developed; Alert, no distress and comfortable.  Alone. EYES: no pallor or icterus OROPHARYNX: no thrush or ulceration; dentures.  NECK: supple, no masses felt LYMPH:  no palpable lymphadenopathy in the cervical, axillary or inguinal regions LUNGS: Decreased breath sounds to auscultation and  Scattered wheezing HEART/CVS: regular rate & rhythm and no murmurs; No lower extremity edema ABDOMEN:abdomen soft, non-tender and normal bowel sounds Musculoskeletal:no cyanosis of digits and no clubbing  PSYCH: alert & oriented x 3 with fluent speech NEURO: no focal motor/sensory deficits SKIN:  Rash noted in radiation portal.   LABORATORY DATA:  I have reviewed the data as listed    Component Value Date/Time   NA 128 (L) 06/22/2017 1307   K 4.2 06/22/2017 1307   CL 90 (L) 06/22/2017 1307   CO2 28 06/22/2017 1307   GLUCOSE 189 (H) 06/22/2017 1307   BUN 7 06/22/2017 1307   CREATININE 0.54 06/22/2017 1307   CREATININE 0.77 01/21/2014 1031   CALCIUM 9.0 06/22/2017 1307   PROT 7.8 05/11/2017 1435   PROT 8.4 (H) 01/21/2014 1031   ALBUMIN 3.6 05/11/2017 1435   ALBUMIN 3.8 01/21/2014 1031   AST 21 05/11/2017 1435   AST 17 01/21/2014 1031   ALT 10 (L) 05/11/2017 1435   ALT 18 01/21/2014 1031   ALKPHOS 79 05/11/2017 1435   ALKPHOS 82 01/21/2014 1031   BILITOT 0.5 05/11/2017 1435   BILITOT 0.4 01/21/2014 1031   GFRNONAA >60 06/22/2017 1307   GFRNONAA >60 01/21/2014 1031   GFRAA >60 06/22/2017 1307   GFRAA >60 01/21/2014 1031    No results found for: SPEP, UPEP  Lab Results  Component Value Date   WBC 7.4 05/11/2017   NEUTROABS 5.8 05/11/2017   HGB 14.9 05/11/2017   HCT 41.8 05/11/2017   MCV 109.9 (H) 05/11/2017   PLT 134 (L) 05/11/2017       Chemistry      Component Value Date/Time   NA 128 (L) 06/22/2017 1307   K 4.2 06/22/2017 1307   CL 90 (L) 06/22/2017 1307   CO2 28 06/22/2017 1307   BUN 7 06/22/2017 1307   CREATININE 0.54 06/22/2017 1307   CREATININE 0.77 01/21/2014 1031      Component Value Date/Time   CALCIUM 9.0 06/22/2017 1307   ALKPHOS 79 05/11/2017 1435   ALKPHOS 82 01/21/2014 1031   AST 21 05/11/2017 1435   AST 17 01/21/2014 1031   ALT 10 (L) 05/11/2017 1435   ALT 18 01/21/2014 1031   BILITOT 0.5 05/11/2017 1435  BILITOT 0.4 01/21/2014 1031    IMPRESSION: 1. Enlarging left upper lobe mass with increasing postobstructive changes in the medial aspect of the left upper lobe extending to the left apex, as above. No mediastinal or hilar lymphadenopathy. No new pulmonary nodules. 2. Mild diffuse bronchial wall thickening with mild centrilobular and paraseptal emphysema. 3. Aortic atherosclerosis, in addition to left main and 3 vessel coronary artery disease. Assessment for potential risk factor modification, dietary therapy or pharmacologic therapy may be warranted, if clinically indicated.  Aortic Atherosclerosis (ICD10-I70.0) and Emphysema (ICD10-J43.9).   Electronically Signed   By: Vinnie Langton M.D.   On: 05/09/2017 16:1  -----------------------------------------  IMPRESSION: 1. Persistent hypermetabolic lung mass compatible with residual/recurrence of tumor. This is not significantly changed in size compared with PET-CT from 06/18/2016. However, the degree of FDG uptake is slightly decreased in the interval. 2. Posttraumatic uptake localizing to subacute fracture involving the left anterior rib. 3. Indeterminate focus within the right posterior chest wall between the scapula and rib. This may also be posttraumatic in etiology. Attention in this area on follow-up imaging advise.   Electronically Signed   By: Kerby Moors M.D.   On: 05/13/2017 12:38 ASSESSMENT & PLAN:    No problem-specific Assessment & Plan notes found for this encounter.     Cammie Sickle, MD 09/21/2017 8:22 AM

## 2017-10-28 ENCOUNTER — Other Ambulatory Visit: Payer: Self-pay | Admitting: Otolaryngology

## 2017-10-28 DIAGNOSIS — Z85118 Personal history of other malignant neoplasm of bronchus and lung: Secondary | ICD-10-CM

## 2017-10-28 DIAGNOSIS — J3800 Paralysis of vocal cords and larynx, unspecified: Secondary | ICD-10-CM

## 2017-11-08 ENCOUNTER — Ambulatory Visit: Admission: RE | Admit: 2017-11-08 | Payer: Medicare Other | Source: Ambulatory Visit

## 2017-11-08 ENCOUNTER — Ambulatory Visit: Payer: Medicare Other

## 2017-11-08 ENCOUNTER — Ambulatory Visit
Admission: RE | Admit: 2017-11-08 | Discharge: 2017-11-08 | Disposition: A | Discharge status: Home / Self Care | Payer: Medicare Other | Source: Ambulatory Visit | Attending: Otolaryngology | Admitting: Otolaryngology

## 2017-11-08 DIAGNOSIS — R29890 Loss of height: Secondary | ICD-10-CM | POA: Diagnosis not present

## 2017-11-08 DIAGNOSIS — Z923 Personal history of irradiation: Secondary | ICD-10-CM | POA: Diagnosis not present

## 2017-11-08 DIAGNOSIS — R918 Other nonspecific abnormal finding of lung field: Secondary | ICD-10-CM | POA: Insufficient documentation

## 2017-11-08 DIAGNOSIS — I2510 Atherosclerotic heart disease of native coronary artery without angina pectoris: Secondary | ICD-10-CM | POA: Diagnosis not present

## 2017-11-08 DIAGNOSIS — I700 Atherosclerosis of aorta: Secondary | ICD-10-CM | POA: Diagnosis not present

## 2017-11-08 DIAGNOSIS — J432 Centrilobular emphysema: Secondary | ICD-10-CM | POA: Diagnosis not present

## 2017-11-08 DIAGNOSIS — X58XXXA Exposure to other specified factors, initial encounter: Secondary | ICD-10-CM | POA: Diagnosis not present

## 2017-11-08 DIAGNOSIS — R933 Abnormal findings on diagnostic imaging of other parts of digestive tract: Secondary | ICD-10-CM | POA: Diagnosis not present

## 2017-11-08 DIAGNOSIS — J438 Other emphysema: Secondary | ICD-10-CM | POA: Insufficient documentation

## 2017-11-08 DIAGNOSIS — S32011A Stable burst fracture of first lumbar vertebra, initial encounter for closed fracture: Secondary | ICD-10-CM | POA: Insufficient documentation

## 2017-11-08 DIAGNOSIS — Z85118 Personal history of other malignant neoplasm of bronchus and lung: Secondary | ICD-10-CM | POA: Diagnosis not present

## 2017-11-08 DIAGNOSIS — J3800 Paralysis of vocal cords and larynx, unspecified: Secondary | ICD-10-CM | POA: Insufficient documentation

## 2017-11-08 LAB — POCT I-STAT CREATININE: CREATININE: 0.5 mg/dL (ref 0.44–1.00)

## 2017-11-08 MED ORDER — IOPAMIDOL (ISOVUE-300) INJECTION 61%
75.0000 mL | Freq: Once | INTRAVENOUS | Status: AC | PRN
  Administered 2017-11-08: 75 mL via INTRAVENOUS

## 2017-11-10 ENCOUNTER — Telehealth: Payer: Self-pay | Admitting: *Deleted

## 2017-11-10 NOTE — Telephone Encounter (Signed)
Madeline Schmitt notified of appointments with Dr. Baruch Gouty and Dr. Jacinto Reap on 11/28/17.  She verbalized understanding with read back of appointment date/time.

## 2017-11-28 ENCOUNTER — Ambulatory Visit
Admission: RE | Admit: 2017-11-28 | Discharge: 2017-11-28 | Disposition: A | Discharge status: Home / Self Care | Payer: Medicare Other | Source: Ambulatory Visit | Attending: Radiation Oncology | Admitting: Radiation Oncology

## 2017-11-28 ENCOUNTER — Inpatient Hospital Stay: Payer: Medicare Other | Attending: Internal Medicine | Admitting: Internal Medicine

## 2017-11-28 ENCOUNTER — Other Ambulatory Visit: Payer: Self-pay

## 2017-11-28 ENCOUNTER — Other Ambulatory Visit: Payer: Self-pay | Admitting: *Deleted

## 2017-11-28 ENCOUNTER — Encounter: Payer: Self-pay | Admitting: Radiation Oncology

## 2017-11-28 ENCOUNTER — Encounter: Payer: Self-pay | Admitting: Internal Medicine

## 2017-11-28 VITALS — BP 102/62 | Temp 96.0°F | Resp 20 | Wt 109.5 lb

## 2017-11-28 VITALS — BP 102/62 | Temp 96.0°F | Resp 20 | Ht 63.0 in

## 2017-11-28 DIAGNOSIS — C3412 Malignant neoplasm of upper lobe, left bronchus or lung: Secondary | ICD-10-CM

## 2017-11-28 DIAGNOSIS — Z923 Personal history of irradiation: Secondary | ICD-10-CM | POA: Diagnosis not present

## 2017-11-28 DIAGNOSIS — R630 Anorexia: Secondary | ICD-10-CM | POA: Insufficient documentation

## 2017-11-28 DIAGNOSIS — M2550 Pain in unspecified joint: Secondary | ICD-10-CM | POA: Insufficient documentation

## 2017-11-28 DIAGNOSIS — Z9221 Personal history of antineoplastic chemotherapy: Secondary | ICD-10-CM | POA: Diagnosis not present

## 2017-11-28 DIAGNOSIS — Z720 Tobacco use: Secondary | ICD-10-CM | POA: Insufficient documentation

## 2017-11-28 DIAGNOSIS — C3402 Malignant neoplasm of left main bronchus: Secondary | ICD-10-CM | POA: Diagnosis not present

## 2017-11-28 DIAGNOSIS — R490 Dysphonia: Secondary | ICD-10-CM | POA: Diagnosis not present

## 2017-11-28 DIAGNOSIS — J449 Chronic obstructive pulmonary disease, unspecified: Secondary | ICD-10-CM | POA: Diagnosis not present

## 2017-11-28 DIAGNOSIS — F17210 Nicotine dependence, cigarettes, uncomplicated: Secondary | ICD-10-CM | POA: Insufficient documentation

## 2017-11-28 DIAGNOSIS — G8929 Other chronic pain: Secondary | ICD-10-CM | POA: Diagnosis not present

## 2017-11-28 NOTE — Assessment & Plan Note (Addendum)
#   LUL mass- highly suspicious for malignancy. Patient declined biopsy- currently s/p  RT [last feb 12th]. MARCH 26th 2019-CT scan shows increasing size of the left upper lobe lesion- left suprahilar lesion currently measuring about 2.3x2.5x5.8cm- associated with atelectasis/radiation changes. awaiting repeat PET scan [April 18th-pending].   # Patient continues to decline biopsy.  Systemic therapy options would be limited-if no biopsy available.  If PET scan is positive for recurrence-question repeat radiation  #Hoarseness of voice/recent evaluation with ENT Candida on Diflucan.[Intolerance]  # COPD- Stable. continue proair q 4-6 hours; continue advair. Stable.   # Active smoker- patient is not interested in smoking cessation.  # follow up in 1 week; to review the/labs..    # 25 minutes face-to-face with the patient discussing the above plan of care; more than 50% of time spent on prognosis/ natural history; counseling and coordination.  # I reviewed the blood work- with the patient in detail; also reviewed the imaging independently [as summarized above]; and with the patient in detail.   Addendum: Images reviewed the tumor conference 4/18; await PET scan.  Cc; Dr.Klein

## 2017-11-28 NOTE — Progress Notes (Signed)
Radiation Oncology Follow up Note  Name: Madeline Schmitt   Date:   11/28/2017 MRN:  [id number] DOB: [birth year]    This 72 y.o. female presents to the clinic today for progressive non-small cell lung cancer of the left hilum status post concurrent chemotherapy for stage IIa disease approximate 1 year prior.  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 She is seen today and is doing fair. She states she has a slight productive cough no hemoptysis. She is poor by mouth intake.  COMPLICATIONS OF TREATMENT: none  FOLLOW UP COMPLIANCE: keeps appointments   PHYSICAL EXAM:  BP 102/62   Temp (!) 96 F (35.6 C)   Resp 20   Wt 109 lb 7.3 oz (49.6 kg)   BMI 19.39 kg/m  Frail-appearing female slightly cachectic in NAD. Well-developed well-nourished patient in NAD. HEENT reveals PERLA, EOMI, discs not visualized.  Oral cavity is clear. No oral mucosal lesions are identified. Neck is clear without evidence of cervical or supraclavicular adenopathy. Lungs are clear to A&P. Cardiac examination is essentially unremarkable with regular rate and rhythm without murmur rub or thrill. Abdomen is benign with no organomegaly or masses noted. Motor sensory and DTR levels are equal and symmetric in the upper and lower extremities. Cranial nerves II through XII are grossly intact. Proprioception is intact. No peripheral adenopathy or  edema is identified. No motor or sensory levels are noted. Crude visual fields are within normal range.  RADIOLOGY RESULTS: CT scan is reviewed and compatible with the above-stated findings  PLAN: at this time I ordered a PET CT scan for better delineation of what is active disease the tumor by CT criteria has central lucency which may repeat represent necrotic disease. Based on her PET/CT she may benefit from additional radiation therapy versuspossible immunotherapy or systemic chemotherapy. I've asked to see her back in 2 weeks for follow-up and will review her PET/CT findings at that time. Case also will be discussed with medical oncology for possible other interventions. Patient's comfort with her treatment plan is seeing medical oncology today.  I would like to take this opportunity to thank you for allowing me to participate in the care of your patient.Noreene Filbert, MD

## 2017-11-28 NOTE — Progress Notes (Signed)
[place] OFFICE PROGRESS NOTE  Patient Care Team: Adin Hector, MD as PCP - General (Internal Medicine)   SUMMARY OF ONCOLOGIC HISTORY:  Oncology History   # NOV 2017- LUL MASS PET- SUV 12; Pt DECLINED BIOPSY. DEC 1st 2017- RT [Dr.Crystal];CT May 23rd 2018- PR  # ERYTHROCYTOSIS- likely sec to smoking [Jak-2/exon 12- NEG]  # Hx of smoking- does not want to quit.; RA- [? Dr.Kernodle]..      Malignant neoplasm of upper lobe of left lung ([place])     INTERVAL HISTORY:  A very pleasant 72 year old female patient with long-standing history of smoking- Left upper lobe mass- highly concerning for malignancy s/p radiation on feb 12th 2018; is here to review the results of the  CT scan.  In the interim patient was evaluated by radiation oncology with a concern for recurrence of left hilar malignancy.  In the interim patient noted to have hoarseness of voice for which she was evaluated by ENT.  Patient was thought to have Candida infection ; patient on Diflucan.  Patient states to have poor tolerance to Diflucan with nausea vomiting.   Patient unfortunately continues to smoke. She continues to chronic shortness of breath/from a COPD. No hemoptysis. No new cough. Has chronic joint pains.  She continues to decline biopsy.   No headaches. Denies any leg swelling. Drinks wine/alcohol every day.   REVIEW OF SYSTEMS:  A complete 10 point review of system is done which is negative except mentioned above/history of present illness.   PAST MEDICAL HISTORY :  Past Medical History:  Diagnosis Date  . Aortic atherosclerosis ([place])   . Arrhythmia   . COPD (chronic obstructive pulmonary disease) ([place])   . Diabetes mellitus type 2, uncomplicated ([place])   . Essential hypertension   . History of pelvic fracture   . Hyperlipidemia, unspecified   . IBS (irritable bowel syndrome)   . Lung cancer ([place])   . Lung mass   . Osteoporosis   . Rheumatoid arthritis involving multiple  sites with positive rheumatoid factor ([place])   . Secondary polycythemia   ; HTN; COPD  PAST SURGICAL HISTORY :   Past Surgical History:  Procedure Laterality Date  . BREAST BIOPSY Bilateral 1970   negative  . COLONOSCOPY     03/30/2002, 12/25/2004, 03/17/2007, 05/08/2010  . Resection of chronic olecranon bursitis    . S/P oral surgery      FAMILY HISTORY :   Family History  Problem Relation Age of Onset  . Lung cancer Brother   . Bone cancer Brother   . Breast cancer Neg Hx     SOCIAL HISTORY:  History of smoking; denies alcohol abuse.  Social History   Tobacco Use  . Smoking status: Current Some Day Smoker    Packs/day: 0.50    Years: 55.00    Pack years: 27.50  . Smokeless tobacco: Never Used  Substance Use Topics  . Alcohol use: Yes    Alcohol/week: 8.4 oz    Types: 14 Glasses of wine per week    Comment: 1-3 glasses of wine a day  . Drug use: No    ALLERGIES:  is allergic to augmentin [amoxicillin-pot clavulanate]; penicillins; ace inhibitors; actonel [risedronate sodium]; celexa [citalopram hydrobromide]; ciprofloxacin; dicyclomine hcl; hydrochlorothiazide w-triamterene; maxzide [triamterene-hctz]; nicoderm [nicotine]; nsaids; oxycodone; and zyban [bupropion].  MEDICATIONS:  Current Outpatient Medications  Medication Sig Dispense Refill  . ADVAIR DISKUS 500-50 MCG/DOSE AEPB   1  . albuterol (PROAIR HFA) 108 (90  Base) MCG/ACT inhaler Inhale 2 puffs into the lungs every 6 (six) hours as needed for wheezing or shortness of breath.    Marland Kitchen amLODipine (NORVASC) 2.5 MG tablet Take 2.5 mg by mouth daily.    Marland Kitchen aspirin 81 MG tablet Take 81 mg by mouth daily.    Marland Kitchen atenolol (TENORMIN) 100 MG tablet Take 100 mg by mouth daily.   0  . Cholecalciferol 1000 units tablet Take 1,000 Units by mouth daily.    . clonazePAM (KLONOPIN) 0.5 MG tablet Take 0.5 mg by mouth 2 (two) times daily as needed for anxiety.    . fluticasone (FLONASE) 50 MCG/ACT nasal spray Place into both nostrils  daily as needed.     Marland Kitchen omeprazole (PRILOSEC) 20 MG capsule Take 20 mg by mouth daily as needed.     Marland Kitchen QUEtiapine (SEROQUEL) 25 MG tablet Take 25 mg by mouth at bedtime.  0  . ondansetron (ZOFRAN) 8 MG tablet Take 1 tablet (8 mg total) by mouth every 8 (eight) hours as needed for nausea or vomiting. (Patient not taking: Reported on 11/28/2017) 20 tablet 2   No current facility-administered medications for this visit.     PHYSICAL EXAMINATION:   BP 102/62   Temp (!) 96 F (35.6 C) (Tympanic)   Resp 20   Ht 5\' 3"  (1.6 m)   BMI 19.39 kg/m   There were no vitals filed for this visit.  GENERAL: Well-nourished well-developed; Alert, no distress and comfortable.  Alone. EYES: no pallor or icterus OROPHARYNX: no thrush or ulceration; dentures.  NECK: supple, no masses felt LYMPH:  no palpable lymphadenopathy in the cervical, axillary or inguinal regions LUNGS: Decreased breath sounds to auscultation and  Scattered wheezing HEART/CVS: regular rate & rhythm and no murmurs; No lower extremity edema ABDOMEN:abdomen soft, non-tender and normal bowel sounds Musculoskeletal:no cyanosis of digits and no clubbing  PSYCH: alert & oriented x 3 with fluent speech NEURO: no focal motor/sensory deficits SKIN:  Rash noted in radiation portal.   LABORATORY DATA:  I have reviewed the data as listed    Component Value Date/Time   NA 128 (L) 06/22/2017 1307   K 4.2 06/22/2017 1307   CL 90 (L) 06/22/2017 1307   CO2 28 06/22/2017 1307   GLUCOSE 189 (H) 06/22/2017 1307   BUN 7 06/22/2017 1307   CREATININE 0.50 11/08/2017 1449   CREATININE 0.77 01/21/2014 1031   CALCIUM 9.0 06/22/2017 1307   PROT 7.8 05/11/2017 1435   PROT 8.4 (H) 01/21/2014 1031   ALBUMIN 3.6 05/11/2017 1435   ALBUMIN 3.8 01/21/2014 1031   AST 21 05/11/2017 1435   AST 17 01/21/2014 1031   ALT 10 (L) 05/11/2017 1435   ALT 18 01/21/2014 1031   ALKPHOS 79 05/11/2017 1435   ALKPHOS 82 01/21/2014 1031   BILITOT 0.5 05/11/2017  1435   BILITOT 0.4 01/21/2014 1031   GFRNONAA >60 06/22/2017 1307   GFRNONAA >60 01/21/2014 1031   GFRAA >60 06/22/2017 1307   GFRAA >60 01/21/2014 1031    No results found for: SPEP, UPEP  Lab Results  Component Value Date   WBC 7.4 05/11/2017   NEUTROABS 5.8 05/11/2017   HGB 14.9 05/11/2017   HCT 41.8 05/11/2017   MCV 109.9 (H) 05/11/2017   PLT 134 (L) 05/11/2017      Chemistry      Component Value Date/Time   NA 128 (L) 06/22/2017 1307   K 4.2 06/22/2017 1307   CL 90 (L) 06/22/2017 1307  CO2 28 06/22/2017 1307   BUN 7 06/22/2017 1307   CREATININE 0.50 11/08/2017 1449   CREATININE 0.77 01/21/2014 1031      Component Value Date/Time   CALCIUM 9.0 06/22/2017 1307   ALKPHOS 79 05/11/2017 1435   ALKPHOS 82 01/21/2014 1031   AST 21 05/11/2017 1435   AST 17 01/21/2014 1031   ALT 10 (L) 05/11/2017 1435   ALT 18 01/21/2014 1031   BILITOT 0.5 05/11/2017 1435   BILITOT 0.4 01/21/2014 1031     IMPRESSION: 1. Progression of disease, with interval enlargement of what is currently a 3.2 x 4.7 x 6.9 cm left upper lobe mass, as above. 2. Diffuse bronchial wall thickening with mild to moderate centrilobular and mild paraseptal emphysema. 3. Aortic atherosclerosis, in addition to left main and 2 vessel coronary artery disease. Assessment for potential risk factor modification, dietary therapy or pharmacologic therapy may be warranted, if clinically indicated. 4. Small amount of pericardial fluid and/or thickening, unlikely to be of hemodynamic significance at this time. 5. New (likely subacute) burst fracture of L1 with 7 mm of retropulsion of fracture fragments and 90% loss of central vertebral body height. 6. Additional incidental findings, as above.  Aortic Atherosclerosis (ICD10-I70.0) and Emphysema (ICD10-J43.9).   Electronically Signed   By: Vinnie Langton M.D.   On: 11/09/2017 11:10 -----------------------------------------  IIMPRESSION: 1. Generalized  abnormal mucosal thickening and hyperenhancement throughout the pharynx and supraglottic larynx is new since the 2018 PET-CT. Coupled with the appearance of the salivary glands, it is possible this is the sequelae of interval head and neck radiation. However, only prior chest radiation treatment is described in the EMR. 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 Recommend direct visualization, and note asymmetric thickening and hyperenhancement of the right vocal fold. 2. Bilateral cervical lymph nodes are diminutive. No lymphadenopathy. 3. Lobulated appearance of the right submandibular gland, but stable since the prior PET-CT and not hypermetabolic at that time. 4.  CT Chest today reported separately.   Electronically Signed   By: Genevie Ann M.D.   On: 11/08/2017 22:18  ASSESSMENT & PLAN:   Malignant neoplasm of upper lobe of left lung ([place]) # LUL mass- highly suspicious for malignancy. Patient declined biopsy- currently s/p  RT [last feb 12th]. MARCH 26th 2019-CT scan shows increasing size of the left upper lobe lesion- left suprahilar lesion currently measuring about 2.3x2.5x5.8cm- associated with atelectasis/radiation changes. awaiting repeat PET scan [April 18th-pending].   # Patient continues to decline biopsy.  Systemic therapy options would be limited-if no biopsy available.  If PET scan is positive for recurrence-question repeat radiation  #Hoarseness of voice/recent evaluation with ENT Candida on Diflucan.[Intolerance]  # COPD- Stable. continue proair q 4-6 hours; continue advair. Stable.   # Active smoker- patient is not interested in smoking cessation.  # follow up in 1 week; to review the/labs..    # 25 minutes face-to-face with the patient discussing the above plan of care; more than 50% of time spent on prognosis/ natural history; counseling and coordination.  # I reviewed the blood work- with the patient in detail; also  reviewed the imaging independently [as summarized above]; and with the patient in detail.   Addendum: Images reviewed the tumor conference 4/18; await PET scan.  Cc; Dr.Klein     Cammie Sickle, MD 12/01/2017 5:21 PM

## 2017-11-30 ENCOUNTER — Other Ambulatory Visit: Payer: Self-pay | Admitting: Internal Medicine

## 2017-11-30 ENCOUNTER — Other Ambulatory Visit: Payer: Self-pay | Admitting: *Deleted

## 2017-12-01 ENCOUNTER — Encounter
Admission: RE | Admit: 2017-12-01 | Discharge: 2017-12-01 | Disposition: A | Discharge status: Home / Self Care | Payer: Medicare Other | Source: Ambulatory Visit | Attending: Radiation Oncology | Admitting: Radiation Oncology

## 2017-12-01 DIAGNOSIS — C3412 Malignant neoplasm of upper lobe, left bronchus or lung: Secondary | ICD-10-CM | POA: Insufficient documentation

## 2017-12-01 LAB — GLUCOSE, CAPILLARY: Glucose-Capillary: 134 mg/dL — ABNORMAL HIGH (ref 65–99)

## 2017-12-01 MED ORDER — FLUDEOXYGLUCOSE F - 18 (FDG) INJECTION
5.6000 | Freq: Once | INTRAVENOUS | Status: AC | PRN
  Administered 2017-12-01: 5.67 via INTRAVENOUS

## 2017-12-07 ENCOUNTER — Inpatient Hospital Stay: Payer: Medicare Other

## 2017-12-07 ENCOUNTER — Inpatient Hospital Stay (HOSPITAL_BASED_OUTPATIENT_CLINIC_OR_DEPARTMENT_OTHER): Payer: Medicare Other | Admitting: Internal Medicine

## 2017-12-07 ENCOUNTER — Other Ambulatory Visit: Payer: Self-pay

## 2017-12-07 VITALS — BP 145/75 | HR 114 | Temp 97.9°F | Resp 22 | Ht 63.0 in | Wt 108.0 lb

## 2017-12-07 DIAGNOSIS — R490 Dysphonia: Secondary | ICD-10-CM

## 2017-12-07 DIAGNOSIS — Z720 Tobacco use: Secondary | ICD-10-CM | POA: Diagnosis not present

## 2017-12-07 DIAGNOSIS — J449 Chronic obstructive pulmonary disease, unspecified: Secondary | ICD-10-CM

## 2017-12-07 DIAGNOSIS — C3412 Malignant neoplasm of upper lobe, left bronchus or lung: Secondary | ICD-10-CM | POA: Diagnosis not present

## 2017-12-07 LAB — COMPREHENSIVE METABOLIC PANEL
ALT: 10 U/L — ABNORMAL LOW (ref 14–54)
ANION GAP: 14 (ref 5–15)
AST: 38 U/L (ref 15–41)
Albumin: 3.2 g/dL — ABNORMAL LOW (ref 3.5–5.0)
Alkaline Phosphatase: 99 U/L (ref 38–126)
BUN: 8 mg/dL (ref 6–20)
CHLORIDE: 89 mmol/L — AB (ref 101–111)
CO2: 25 mmol/L (ref 22–32)
Calcium: 9.2 mg/dL (ref 8.9–10.3)
Creatinine, Ser: 0.74 mg/dL (ref 0.44–1.00)
Glucose, Bld: 151 mg/dL — ABNORMAL HIGH (ref 65–99)
Potassium: 4.5 mmol/L (ref 3.5–5.1)
SODIUM: 128 mmol/L — AB (ref 135–145)
Total Bilirubin: 0.6 mg/dL (ref 0.3–1.2)
Total Protein: 7.7 g/dL (ref 6.5–8.1)

## 2017-12-07 LAB — CBC WITH DIFFERENTIAL/PLATELET
BASOS PCT: 1 %
Basophils Absolute: 0.1 10*3/uL (ref 0–0.1)
Eosinophils Absolute: 0 10*3/uL (ref 0–0.7)
Eosinophils Relative: 0 %
HEMATOCRIT: 41.7 % (ref 35.0–47.0)
HEMOGLOBIN: 14.7 g/dL (ref 12.0–16.0)
LYMPHS ABS: 1.3 10*3/uL (ref 1.0–3.6)
Lymphocytes Relative: 13 %
MCH: 39.6 pg — AB (ref 26.0–34.0)
MCHC: 35.3 g/dL (ref 32.0–36.0)
MCV: 112 fL — ABNORMAL HIGH (ref 80.0–100.0)
MONOS PCT: 4 %
Monocytes Absolute: 0.4 10*3/uL (ref 0.2–0.9)
NEUTROS ABS: 8.4 10*3/uL — AB (ref 1.4–6.5)
NEUTROS PCT: 82 %
Platelets: 219 10*3/uL (ref 150–440)
RBC: 3.72 MIL/uL — AB (ref 3.80–5.20)
RDW: 13 % (ref 11.5–14.5)
WBC: 10.2 10*3/uL (ref 3.6–11.0)

## 2017-12-07 NOTE — Progress Notes (Signed)
[place] OFFICE PROGRESS NOTE  Patient Care Team: Adin Hector, MD as PCP - General (Internal Medicine)   SUMMARY OF ONCOLOGIC HISTORY:  Oncology History   # NOV 2017- LUL MASS PET- SUV 12; Pt DECLINED BIOPSY. DEC 1st 2017- RT [Dr.Crystal];CT May 23rd 2018- PR  # ERYTHROCYTOSIS- likely sec to smoking [Jak-2/exon 12- NEG]  # Hx of smoking- does not want to quit.; RA- [? Dr.Kernodle]..      Malignant neoplasm of upper lobe of left lung ([place])     INTERVAL HISTORY:  A very pleasant 72 year old female patient with long-standing history of smoking- Left upper lobe mass- highly concerning for malignancy s/p radiation on feb 12th 2018; is here to review the results of the PET scan.  In the interim unfortunately patient had a CT scan that showed concern for progressive disease in the left upper lung/paramediastinal lesion.   In the interim patient noted to have hoarseness of voice for which she was evaluated by ENT.  Patient was thought to have Candida infection; Currently on clotrimazole.  Continues to have hoarseness of voice not improving.  Patient is chronic shortness of breath chronic cough.  No hemoptysis.   REVIEW OF SYSTEMS:  A complete 10 point review of system is done which is negative except mentioned above/history of present illness.   PAST MEDICAL HISTORY :  Past Medical History:  Diagnosis Date  . Aortic atherosclerosis ([place])   . Arrhythmia   . COPD (chronic obstructive pulmonary disease) ([place])   . Diabetes mellitus type 2, uncomplicated ([place])   . Essential hypertension   . History of pelvic fracture   . Hyperlipidemia, unspecified   . IBS (irritable bowel syndrome)   . Lung cancer ([place])   . Lung mass   . Osteoporosis   . Rheumatoid arthritis involving multiple sites with positive rheumatoid factor ([place])   . Secondary polycythemia   ; HTN; COPD  PAST SURGICAL HISTORY :   Past Surgical History:  Procedure Laterality Date  . BREAST  BIOPSY Bilateral 1970   negative  . COLONOSCOPY     03/30/2002, 12/25/2004, 03/17/2007, 05/08/2010  . Resection of chronic olecranon bursitis    . S/P oral surgery      FAMILY HISTORY :   Family History  Problem Relation Age of Onset  . Lung cancer Brother   . Bone cancer Brother   . Breast cancer Neg Hx     SOCIAL HISTORY:  History of smoking; denies alcohol abuse.  Social History   Tobacco Use  . Smoking status: Current Some Day Smoker    Packs/day: 0.50    Years: 55.00    Pack years: 27.50  . Smokeless tobacco: Never Used  Substance Use Topics  . Alcohol use: Yes    Alcohol/week: 8.4 oz    Types: 14 Glasses of wine per week    Comment: 1-3 glasses of wine a day  . Drug use: No    ALLERGIES:  is allergic to augmentin [amoxicillin-pot clavulanate]; penicillins; ace inhibitors; actonel [risedronate sodium]; celexa [citalopram hydrobromide]; ciprofloxacin; dicyclomine hcl; hydrochlorothiazide w-triamterene; maxzide [triamterene-hctz]; nicoderm [nicotine]; nsaids; oxycodone; and zyban [bupropion].  MEDICATIONS:  Current Outpatient Medications  Medication Sig Dispense Refill  . ADVAIR DISKUS 500-50 MCG/DOSE AEPB   1  . albuterol (PROAIR HFA) 108 (90 Base) MCG/ACT inhaler Inhale 2 puffs into the lungs every 6 (six) hours as needed for wheezing or shortness of breath.    Marland Kitchen amLODipine (NORVASC) 2.5 MG tablet Take 2.5  mg by mouth daily.    Marland Kitchen aspirin 81 MG tablet Take 81 mg by mouth daily.    Marland Kitchen atenolol (TENORMIN) 100 MG tablet Take 100 mg by mouth daily.   0  . Cholecalciferol 1000 units tablet Take 1,000 Units by mouth daily.    . clonazePAM (KLONOPIN) 0.5 MG tablet Take 0.5 mg by mouth 2 (two) times daily as needed for anxiety.    . fluticasone (FLONASE) 50 MCG/ACT nasal spray Place into both nostrils daily as needed.     Marland Kitchen omeprazole (PRILOSEC) 20 MG capsule Take 20 mg by mouth daily as needed.     . ondansetron (ZOFRAN) 8 MG tablet Take 1 tablet (8 mg total) by mouth every  8 (eight) hours as needed for nausea or vomiting. 20 tablet 2  . QUEtiapine (SEROQUEL) 25 MG tablet Take 25 mg by mouth at bedtime.  0   No current facility-administered medications for this visit.     PHYSICAL EXAMINATION:   BP (!) 145/75   Pulse (!) 114   Temp 97.9 F (36.6 C) (Tympanic)   Resp (!) 22   Ht 5\' 3"  (1.6 m)   Wt 108 lb (49 kg)   SpO2 92%   BMI 19.13 kg/m   Filed Weights   12/07/17 1240  Weight: 108 lb (49 kg)    GENERAL: Well-nourished well-developed; Alert, no distress and comfortable.  Alone. EYES: no pallor or icterus OROPHARYNX: no thrush or ulceration; dentures.  NECK: supple, no masses felt LYMPH:  no palpable lymphadenopathy in the cervical, axillary or inguinal regions LUNGS: Decreased breath sounds to auscultation and  Scattered wheezing HEART/CVS: regular rate & rhythm and no murmurs; No lower extremity edema ABDOMEN:abdomen soft, non-tender and normal bowel sounds Musculoskeletal:no cyanosis of digits and no clubbing  PSYCH: alert & oriented x 3 with fluent speech NEURO: no focal motor/sensory deficits SKIN:  Rash noted in radiation portal.   LABORATORY DATA:  I have reviewed the data as listed    Component Value Date/Time   NA 128 (L) 12/07/2017 1120   K 4.5 12/07/2017 1120   CL 89 (L) 12/07/2017 1120   CO2 25 12/07/2017 1120   GLUCOSE 151 (H) 12/07/2017 1120   BUN 8 12/07/2017 1120   CREATININE 0.74 12/07/2017 1120   CREATININE 0.77 01/21/2014 1031   CALCIUM 9.2 12/07/2017 1120   PROT 7.7 12/07/2017 1120   PROT 8.4 (H) 01/21/2014 1031   ALBUMIN 3.2 (L) 12/07/2017 1120   ALBUMIN 3.8 01/21/2014 1031   AST 38 12/07/2017 1120   AST 17 01/21/2014 1031   ALT 10 (L) 12/07/2017 1120   ALT 18 01/21/2014 1031   ALKPHOS 99 12/07/2017 1120   ALKPHOS 82 01/21/2014 1031   BILITOT 0.6 12/07/2017 1120   BILITOT 0.4 01/21/2014 1031   GFRNONAA >60 12/07/2017 1120   GFRNONAA >60 01/21/2014 1031   GFRAA >60 12/07/2017 1120   GFRAA >60  01/21/2014 1031    No results found for: SPEP, UPEP  Lab Results  Component Value Date   WBC 10.2 12/07/2017   NEUTROABS 8.4 (H) 12/07/2017   HGB 14.7 12/07/2017   HCT 41.7 12/07/2017   MCV 112.0 (H) 12/07/2017   PLT 219 12/07/2017      Chemistry      Component Value Date/Time   NA 128 (L) 12/07/2017 1120   K 4.5 12/07/2017 1120   CL 89 (L) 12/07/2017 1120   CO2 25 12/07/2017 1120   BUN 8 12/07/2017 1120   CREATININE  0.74 12/07/2017 1120   CREATININE 0.77 01/21/2014 1031      Component Value Date/Time   CALCIUM 9.2 12/07/2017 1120   ALKPHOS 99 12/07/2017 1120   ALKPHOS 82 01/21/2014 1031   AST 38 12/07/2017 1120   AST 17 01/21/2014 1031   ALT 10 (L) 12/07/2017 1120   ALT 18 01/21/2014 1031   BILITOT 0.6 12/07/2017 1120   BILITOT 0.4 01/21/2014 1031     IMPRESSION: 1. Progression of disease, with interval enlargement of what is currently a 3.2 x 4.7 x 6.9 cm left upper lobe mass, as above. 2. Diffuse bronchial wall thickening with mild to moderate centrilobular and mild paraseptal emphysema. 3. Aortic atherosclerosis, in addition to left main and 2 vessel coronary artery disease. Assessment for potential risk factor modification, dietary therapy or pharmacologic therapy may be warranted, if clinically indicated. 4. Small amount of pericardial fluid and/or thickening, unlikely to be of hemodynamic significance at this time. 5. New (likely subacute) burst fracture of L1 with 7 mm of retropulsion of fracture fragments and 90% loss of central vertebral body height. 6. Additional incidental findings, as above.  Aortic Atherosclerosis (ICD10-I70.0) and Emphysema (ICD10-J43.9).   Electronically Signed   By: Vinnie Langton M.D.   On: 11/09/2017 11:10 -----------------------------------------  IIMPRESSION: 1. Generalized abnormal mucosal thickening and hyperenhancement throughout the pharynx and supraglottic larynx is new since the 2018 PET-CT. Coupled with  the appearance of the salivary glands, it is possible this is the sequelae of interval head and neck radiation. However, only prior chest radiation treatment is described in the EMR. 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 Recommend direct visualization, and note asymmetric thickening and hyperenhancement of the right vocal fold. 2. Bilateral cervical lymph nodes are diminutive. No lymphadenopathy. 3. Lobulated appearance of the right submandibular gland, but stable since the prior PET-CT and not hypermetabolic at that time. 4.  CT Chest today reported separately.   Electronically Signed   By: Genevie Ann M.D.   On: 11/08/2017 22:18  ASSESSMENT & PLAN:   Malignant neoplasm of upper lobe of left lung ([place]) # LUL mass- highly suspicious for malignancy. Patient declined biopsy- currently s/p  RT [last Feb 12th 2018]. MARCH 26th 2019-CT scan shows increasing size of the left upper lobe lesion- left suprahilar lesion currently measuring about 2.3x2.5x5.8cm- associated with atelectasis/radiation changes.  December 01, 2017-PET scan confirms likely recurrence/with increased FDG activity around 10 and also increasing size of the left perihilar/mediastinal soft tissue mass  # Patient continues to decline-biopsy at this time.  Discussed regarding repeat irradiation [as discussed with the tumor conference]-patient not sure/reluctant-as she feels that that might not "cure" I did reiterate to the patient that this is likely incurable.   #Hoarseness of voice/recent evaluation with ENT-likely secondary to left hilar mass/malignancy. On clotrimazole for candida. Discussed with Dr.Vaught.   # COPD- Stable. continue proair q 4-6 hours; continue advair. Stable/Active smoker  # Follow-up in 4 weeks/labs. Discussed with Dr. Kendall Flack  # I reviewed the blood work- with the patient in detail; also reviewed the imaging independently [as summarized above]; and with  the patient in detail.   # 25 minutes face-to-face with the patient discussing the above plan of care; more than 50% of time spent on prognosis/ natural history; counseling and coordination.  # I reviewed the blood work- with the patient in detail; also reviewed the imaging independently [as summarized above]; and with the patient in detail.   Cc; Dr.Klein  Cammie Sickle, MD 12/07/2017 1:52 PM

## 2017-12-07 NOTE — Assessment & Plan Note (Addendum)
#   LUL mass- highly suspicious for malignancy. Patient declined biopsy- currently s/p  RT [last Feb 12th 2018]. MARCH 26th 2019-CT scan shows increasing size of the left upper lobe lesion- left suprahilar lesion currently measuring about 2.3x2.5x5.8cm- associated with atelectasis/radiation changes.  December 01, 2017-PET scan confirms likely recurrence/with increased FDG activity around 10 and also increasing size of the left perihilar/mediastinal soft tissue mass  # Patient continues to decline-biopsy at this time.  Discussed regarding repeat irradiation [as discussed with the tumor conference]-patient not sure/reluctant-as she feels that that might not "cure" I did reiterate to the patient that this is likely incurable.   #Hoarseness of voice/recent evaluation with ENT-likely secondary to left hilar mass/malignancy. On clotrimazole for candida. Discussed with Dr.Vaught.   # COPD- Stable. continue proair q 4-6 hours; continue advair. Stable/Active smoker  # Follow-up in 4 weeks/labs. Discussed with Dr. Mariella Saa office will make arrangements for follow-up  # I reviewed the blood work- with the patient in detail; also reviewed the imaging independently [as summarized above]; and with the patient in detail.   # 25 minutes face-to-face with the patient discussing the above plan of care; more than 50% of time spent on prognosis/ natural history; counseling and coordination.  # I reviewed the blood work- with the patient in detail; also reviewed the imaging independently [as summarized above]; and with the patient in detail.   Cc; Dr.Klein

## 2017-12-15 ENCOUNTER — Ambulatory Visit: Payer: Medicare Other | Admitting: Radiation Oncology

## 2017-12-15 ENCOUNTER — Other Ambulatory Visit: Payer: Self-pay | Admitting: *Deleted

## 2017-12-19 ENCOUNTER — Other Ambulatory Visit: Payer: Self-pay | Admitting: *Deleted

## 2017-12-19 ENCOUNTER — Encounter: Payer: Self-pay | Admitting: Radiation Oncology

## 2017-12-19 ENCOUNTER — Ambulatory Visit
Admission: RE | Admit: 2017-12-19 | Discharge: 2017-12-19 | Disposition: A | Discharge status: Home / Self Care | Payer: Medicare Other | Source: Ambulatory Visit | Attending: Radiation Oncology | Admitting: Radiation Oncology

## 2017-12-19 ENCOUNTER — Other Ambulatory Visit: Payer: Self-pay

## 2017-12-19 VITALS — BP 143/93 | HR 111 | Temp 96.2°F | Resp 18 | Wt 110.7 lb

## 2017-12-19 DIAGNOSIS — R490 Dysphonia: Secondary | ICD-10-CM | POA: Diagnosis not present

## 2017-12-19 DIAGNOSIS — C3402 Malignant neoplasm of left main bronchus: Secondary | ICD-10-CM | POA: Diagnosis present

## 2017-12-19 DIAGNOSIS — Z923 Personal history of irradiation: Secondary | ICD-10-CM | POA: Insufficient documentation

## 2017-12-19 DIAGNOSIS — C3412 Malignant neoplasm of upper lobe, left bronchus or lung: Secondary | ICD-10-CM

## 2017-12-19 MED ORDER — MAGIC MOUTHWASH: 5.0000 mL | Freq: Four times a day (QID) | ORAL | 0 refills | Status: DC | PRN

## 2017-12-19 NOTE — Progress Notes (Signed)
Radiation Oncology Follow up Note  Name: Madeline Schmitt   Date:   12/19/2017 MRN:  [id number] DOB: [birth year]    This 72 y.o. female presents to the clinic today for follow-up status post concurrent chemoradiation therapy for stage IIa non-small cell lung cancer back in December 2017.  REFERRING PROVIDER: Adin Hector, MD  HPI: patient is a 72 year old female now seen out a year and a half having completed concurrent chemotherapy for stage IIa non-small cell lung cancer. I saw her recently as she has progressive disease in her left chest abutting the aorta. We performed a PET CT scan showing.interval increase in size and hypermetabolic activity in the paramediastinal mass in the left upper lobe. She continues to have hoarseness most likely related to left recurrent laryngeal nerve paralysis. She is requested today some Magic mouthwash as she feels that may help her hoarseness. She specifically denies cough hemoptysis or chest tightness.  COMPLICATIONS OF TREATMENT: none  FOLLOW UP COMPLIANCE: keeps appointments   PHYSICAL EXAM:  BP (!) 143/93   Pulse (!) 111   Temp (!) 96.2 F (35.7 C)   Resp 18   Wt 110 lb 10.7 oz (50.2 kg)   BMI 19.60 kg/m  Well-developed well-nourished patient in NAD. HEENT reveals PERLA, EOMI, discs not visualized.  Oral cavity is clear. No oral mucosal lesions are identified. Neck is clear without evidence of cervical or supraclavicular adenopathy. Lungs are clear to A&P. Cardiac examination is essentially unremarkable with regular rate and rhythm without murmur rub or thrill. Abdomen is benign with no organomegaly or masses noted. Motor sensory and DTR levels are equal and symmetric in the upper and lower extremities. Cranial nerves II through XII are grossly intact. Proprioception is intact. No peripheral adenopathy or edema is identified. No motor or sensory levels are noted. Crude visual fields are within normal range.  RADIOLOGY RESULTS: PET CT scans  reviewed and compatible with the above-stated findings  PLAN: at this time I to go ahead with a course of radiation therapy for salvage to her hypermetabolic mass in the left upper lobe. Would plan on delivering 3750 cGy in 15 fractions using PET CT fusion study as well as4D in our treatment planning. Risks and benefits of treatment including treating previously irradiated lung possible dysphasia secondary radiation esophagitis fatigue alteration of blood counts skin reaction all were discussed in detail. I would choose I am RT treatment planning and delivery based on the previous radiation fields close proximity to the heart and mediastinum and esophagus. I personally set up and ordered CT simulation. I've also ordered the patient Magic mouthwash to see if that helps with some of her hoarseness. Patient comprehend my treatment plan well.  I would like to take this opportunity to thank you for allowing me to participate in the care of your patient.Noreene Filbert, MD

## 2017-12-21 ENCOUNTER — Other Ambulatory Visit: Payer: Self-pay | Admitting: *Deleted

## 2017-12-27 ENCOUNTER — Ambulatory Visit
Admission: RE | Admit: 2017-12-27 | Discharge: 2017-12-27 | Disposition: A | Discharge status: Home / Self Care | Payer: Medicare Other | Source: Ambulatory Visit | Attending: Radiation Oncology | Admitting: Radiation Oncology

## 2017-12-27 DIAGNOSIS — C3402 Malignant neoplasm of left main bronchus: Secondary | ICD-10-CM | POA: Diagnosis not present

## 2017-12-27 DIAGNOSIS — Z510 Encounter for antineoplastic radiation therapy: Secondary | ICD-10-CM | POA: Diagnosis not present

## 2017-12-27 DIAGNOSIS — F17210 Nicotine dependence, cigarettes, uncomplicated: Secondary | ICD-10-CM | POA: Insufficient documentation

## 2017-12-30 ENCOUNTER — Other Ambulatory Visit: Payer: Self-pay | Admitting: *Deleted

## 2017-12-30 DIAGNOSIS — Z510 Encounter for antineoplastic radiation therapy: Secondary | ICD-10-CM | POA: Diagnosis not present

## 2017-12-30 DIAGNOSIS — C3412 Malignant neoplasm of upper lobe, left bronchus or lung: Secondary | ICD-10-CM

## 2018-01-04 ENCOUNTER — Inpatient Hospital Stay: Payer: Medicare Other | Attending: Internal Medicine | Admitting: Internal Medicine

## 2018-01-04 DIAGNOSIS — M7989 Other specified soft tissue disorders: Secondary | ICD-10-CM | POA: Diagnosis not present

## 2018-01-04 DIAGNOSIS — R0602 Shortness of breath: Secondary | ICD-10-CM

## 2018-01-04 DIAGNOSIS — C3412 Malignant neoplasm of upper lobe, left bronchus or lung: Secondary | ICD-10-CM | POA: Insufficient documentation

## 2018-01-04 DIAGNOSIS — Z720 Tobacco use: Secondary | ICD-10-CM | POA: Insufficient documentation

## 2018-01-04 DIAGNOSIS — J449 Chronic obstructive pulmonary disease, unspecified: Secondary | ICD-10-CM | POA: Insufficient documentation

## 2018-01-04 DIAGNOSIS — R490 Dysphonia: Secondary | ICD-10-CM | POA: Insufficient documentation

## 2018-01-04 NOTE — Assessment & Plan Note (Signed)
#  Recurrent lung cancer [clinical; not biopsy-proven]-PET 2019-left perihilar/mediastinal soft tissue-symptomatic with hoarseness of voice.  Worse  #Proceed with radiation approximately 15 fractions; discussed the tumor conference; discussed with Dr. Donella Stade.  #Hoarseness of voice-secondary to recurrent laryngeal nerve involvement; do not think this is thrush.  Discussed with Dr. Pryor Ochoa.  Stable.  #Bilateral ankle swelling-secondary to third spacing/malnutrition; underlying malignancy.  New/worse recommend leg elevation/increasing protein intake.  # COPD- Stable. continue proair q 4-6 hours; continue advair. Stable/Active smoker  # follow up in 6 week/no labs.  Will order CT scan at that visit  # 25 minutes face-to-face with the patient discussing the above plan of care; more than 50% of time spent on prognosis/ natural history; counseling and coordination.  Cc; Dr.Klein

## 2018-01-04 NOTE — Progress Notes (Signed)
[facility] OFFICE PROGRESS NOTE  Patient Care Team: Adin Hector, MD as PCP - General (Internal Medicine)  Cancer Staging No matching staging information was found for the patient.   Oncology History   # NOV 2017- LUL MASS PET- SUV 12; Pt DECLINED BIOPSY. DEC 1st 2017- RT [Dr.Crystal];CT May 23rd 2018- PR  # March/April 2019- recurrence based on PET/ left vocal cord paralysis [dr.Vaught]  # ERYTHROCYTOSIS- likely sec to smoking [Jak-2/exon 12- NEG]  # Hx of smoking- does not want to quit.; RA- [? Dr.Kernodle]..   DIAGNOSIS: [ April2019] Recurrent lung ca [Bx- NO done]  STAGE:  III      ;GOALS: palliative  CURRENT/MOST RECENT THERAPY [June 3rd 2019 ] -plan RT      Malignant neoplasm of upper lobe of left lung ([place])      INTERVAL HISTORY:  Madeline Schmitt 72 y.o.  female pleasant patient above history of recurrent lung cancer is here for follow-up  In the interim patient was evaluated by radiation oncology; recommend starting palliative radiation.  She is starting radiation next week.  She continues to complain of hoarseness of voice.  Continues chronic shortness of breath.  No hemoptysis.  Review of Systems  Constitutional: Positive for malaise/fatigue and weight loss. Negative for chills, diaphoresis and fever.  HENT: Positive for sore throat. Negative for nosebleeds.   Eyes: Negative for double vision.  Respiratory: Positive for shortness of breath and wheezing. Negative for cough, hemoptysis and sputum production.   Cardiovascular: Negative for chest pain, palpitations, orthopnea and leg swelling.  Gastrointestinal: Negative for abdominal pain, blood in stool, constipation, diarrhea, heartburn, melena, nausea and vomiting.  Genitourinary: Negative for dysuria, frequency and urgency.  Musculoskeletal: Negative for back pain and joint pain.  Skin: Negative.  Negative for itching and rash.  Neurological: Negative for dizziness, tingling, focal  weakness, weakness and headaches.  Endo/Heme/Allergies: Does not bruise/bleed easily.  Psychiatric/Behavioral: Negative for depression. The patient is not nervous/anxious and does not have insomnia.       PAST MEDICAL HISTORY :  Past Medical History:  Diagnosis Date  . Aortic atherosclerosis ([place])   . Arrhythmia   . COPD (chronic obstructive pulmonary disease) ([place])   . Diabetes mellitus type 2, uncomplicated ([place])   . Essential hypertension   . History of pelvic fracture   . Hyperlipidemia, unspecified   . IBS (irritable bowel syndrome)   . Lung cancer ([place])   . Lung mass   . Osteoporosis   . Rheumatoid arthritis involving multiple sites with positive rheumatoid factor ([place])   . Secondary polycythemia     PAST SURGICAL HISTORY :   Past Surgical History:  Procedure Laterality Date  . BREAST BIOPSY Bilateral 1970   negative  . COLONOSCOPY     03/30/2002, 12/25/2004, 03/17/2007, 05/08/2010  . Resection of chronic olecranon bursitis    . S/P oral surgery      FAMILY HISTORY :   Family History  Problem Relation Age of Onset  . Lung cancer Brother   . Bone cancer Brother   . Breast cancer Neg Hx     SOCIAL HISTORY:   Social History   Tobacco Use  . Smoking status: Current Some Day Smoker    Packs/day: 0.50    Years: 55.00    Pack years: 27.50  . Smokeless tobacco: Never Used  Substance Use Topics  . Alcohol use: Yes    Alcohol/week: 8.4 oz    Types: 14 Glasses of wine  per week    Comment: 1-3 glasses of wine a day  . Drug use: No    ALLERGIES:  is allergic to augmentin [amoxicillin-pot clavulanate]; penicillins; ace inhibitors; actonel [risedronate sodium]; celexa [citalopram hydrobromide]; ciprofloxacin; dicyclomine hcl; hydrochlorothiazide w-triamterene; maxzide [triamterene-hctz]; nicoderm [nicotine]; nsaids; oxycodone; and zyban [bupropion].  MEDICATIONS:  Current Outpatient Medications  Medication Sig Dispense Refill  . ADVAIR DISKUS 500-50 MCG/DOSE  AEPB   1  . albuterol (PROAIR HFA) 108 (90 Base) MCG/ACT inhaler Inhale 2 puffs into the lungs every 6 (six) hours as needed for wheezing or shortness of breath.    Marland Kitchen amLODipine (NORVASC) 2.5 MG tablet Take 2.5 mg by mouth daily.    Marland Kitchen aspirin 81 MG tablet Take 81 mg by mouth daily.    Marland Kitchen atenolol (TENORMIN) 50 MG tablet Take 100 mg by mouth daily.   0  . azelastine (ASTELIN) 0.1 % nasal spray Place into the nose.    . Cholecalciferol 1000 units tablet Take 1,000 Units by mouth daily.    . clonazePAM (KLONOPIN) 0.5 MG tablet Take 0.5 mg by mouth 2 (two) times daily as needed for anxiety.    . magic mouthwash SOLN Take 5 mLs by mouth 4 (four) times daily as needed for mouth pain. 240 mL 0  . omeprazole (PRILOSEC) 20 MG capsule Take 20 mg by mouth daily as needed.     . ondansetron (ZOFRAN) 8 MG tablet Take 1 tablet (8 mg total) by mouth every 8 (eight) hours as needed for nausea or vomiting. 20 tablet 2  . QUEtiapine (SEROQUEL) 25 MG tablet Take 25 mg by mouth at bedtime.  0   No current facility-administered medications for this visit.     PHYSICAL EXAMINATION: ECOG PERFORMANCE STATUS: 1 - Symptomatic but completely ambulatory  There were no vitals taken for this visit.  There were no vitals filed for this visit.  GENERAL: Madeline Schmitt Caucasian female patient walking with a cane.  Alert, no distress and comfortable. Alone. Marland Kitchen  EYES: no pallor or icterus OROPHARYNX: no thrush or ulceration; NECK: supple; no lymph nodes felt. LYMPH:  no palpable lymphadenopathy in the axillary or inguinal regions LUNGS: Decreased breath sounds auscultation bilaterally. No wheeze or crackles HEART/CVS: regular rate & rhythm and no murmurs; No lower extremity edema ABDOMEN:abdomen soft, non-tender and normal bowel sounds. No hepatomegaly or splenomegaly.  Musculoskeletal:no cyanosis of digits and no clubbing  PSYCH: alert & oriented x 3 with fluent speech NEURO: no focal motor/sensory deficits SKIN:   no rashes or significant lesions    LABORATORY DATA:  I have reviewed the data as listed    Component Value Date/Time   NA 128 (L) 12/07/2017 1120   K 4.5 12/07/2017 1120   CL 89 (L) 12/07/2017 1120   CO2 25 12/07/2017 1120   GLUCOSE 151 (H) 12/07/2017 1120   BUN 8 12/07/2017 1120   CREATININE 0.74 12/07/2017 1120   CREATININE 0.77 01/21/2014 1031   CALCIUM 9.2 12/07/2017 1120   PROT 7.7 12/07/2017 1120   PROT 8.4 (H) 01/21/2014 1031   ALBUMIN 3.2 (L) 12/07/2017 1120   ALBUMIN 3.8 01/21/2014 1031   AST 38 12/07/2017 1120   AST 17 01/21/2014 1031   ALT 10 (L) 12/07/2017 1120   ALT 18 01/21/2014 1031   ALKPHOS 99 12/07/2017 1120   ALKPHOS 82 01/21/2014 1031   BILITOT 0.6 12/07/2017 1120   BILITOT 0.4 01/21/2014 1031   GFRNONAA >60 12/07/2017 1120   GFRNONAA >60 01/21/2014 1031   GFRAA >  60 12/07/2017 1120   GFRAA >60 01/21/2014 1031    No results found for: SPEP, UPEP  Lab Results  Component Value Date   WBC 10.2 12/07/2017   NEUTROABS 8.4 (H) 12/07/2017   HGB 14.7 12/07/2017   HCT 41.7 12/07/2017   MCV 112.0 (H) 12/07/2017   PLT 219 12/07/2017      Chemistry      Component Value Date/Time   NA 128 (L) 12/07/2017 1120   K 4.5 12/07/2017 1120   CL 89 (L) 12/07/2017 1120   CO2 25 12/07/2017 1120   BUN 8 12/07/2017 1120   CREATININE 0.74 12/07/2017 1120   CREATININE 0.77 01/21/2014 1031      Component Value Date/Time   CALCIUM 9.2 12/07/2017 1120   ALKPHOS 99 12/07/2017 1120   ALKPHOS 82 01/21/2014 1031   AST 38 12/07/2017 1120   AST 17 01/21/2014 1031   ALT 10 (L) 12/07/2017 1120   ALT 18 01/21/2014 1031   BILITOT 0.6 12/07/2017 1120   BILITOT 0.4 01/21/2014 1031       RADIOGRAPHIC STUDIES: I have personally reviewed the radiological images as listed and agreed with the findings in the report. No results found.   ASSESSMENT & PLAN:  Malignant neoplasm of upper lobe of left lung ([place]) #Recurrent lung cancer [clinical; not biopsy-proven]-PET  2019-left perihilar/mediastinal soft tissue-symptomatic with hoarseness of voice.  Worse  #Proceed with radiation approximately 15 fractions; discussed the tumor conference; discussed with Dr. Donella Stade.  #Hoarseness of voice-secondary to recurrent laryngeal nerve involvement; do not think this is thrush.  Discussed with Dr. Pryor Ochoa.  Stable.  #Bilateral ankle swelling-secondary to third spacing/malnutrition; underlying malignancy.  New/worse recommend leg elevation/increasing protein intake.  # COPD- Stable. continue proair q 4-6 hours; continue advair. Stable/Active smoker  # follow up in 6 week/no labs.  Will order CT scan at that visit  # 25 minutes face-to-face with the patient discussing the above plan of care; more than 50% of time spent on prognosis/ natural history; counseling and coordination.  Cc; Dr.Klein   No orders of the defined types were placed in this encounter.  All questions were answered. The patient knows to call the clinic with any problems, questions or concerns.      Cammie Sickle, MD 01/04/2018 4:52 PM

## 2018-01-05 ENCOUNTER — Ambulatory Visit
Admission: RE | Admit: 2018-01-05 | Discharge: 2018-01-05 | Disposition: A | Discharge status: Home / Self Care | Payer: Medicare Other | Source: Ambulatory Visit | Attending: Radiation Oncology | Admitting: Radiation Oncology

## 2018-01-10 ENCOUNTER — Ambulatory Visit
Admission: RE | Admit: 2018-01-10 | Discharge: 2018-01-10 | Disposition: A | Discharge status: Home / Self Care | Payer: Medicare Other | Source: Ambulatory Visit | Attending: Radiation Oncology | Admitting: Radiation Oncology

## 2018-01-10 DIAGNOSIS — Z510 Encounter for antineoplastic radiation therapy: Secondary | ICD-10-CM | POA: Diagnosis not present

## 2018-01-11 ENCOUNTER — Other Ambulatory Visit: Payer: Self-pay | Admitting: *Deleted

## 2018-01-11 ENCOUNTER — Ambulatory Visit
Admission: RE | Admit: 2018-01-11 | Discharge: 2018-01-11 | Disposition: A | Discharge status: Home / Self Care | Payer: Medicare Other | Source: Ambulatory Visit | Attending: Radiation Oncology | Admitting: Radiation Oncology

## 2018-01-11 DIAGNOSIS — Z510 Encounter for antineoplastic radiation therapy: Secondary | ICD-10-CM | POA: Diagnosis not present

## 2018-01-11 MED ORDER — SUCRALFATE 1 G PO TABS: 1.0000 g | ORAL_TABLET | Freq: Three times a day (TID) | ORAL | 3 refills | Status: DC

## 2018-01-12 ENCOUNTER — Ambulatory Visit
Admission: RE | Admit: 2018-01-12 | Discharge: 2018-01-12 | Disposition: A | Discharge status: Home / Self Care | Payer: Medicare Other | Source: Ambulatory Visit | Attending: Radiation Oncology | Admitting: Radiation Oncology

## 2018-01-12 ENCOUNTER — Other Ambulatory Visit: Payer: Self-pay | Admitting: *Deleted

## 2018-01-12 DIAGNOSIS — Z510 Encounter for antineoplastic radiation therapy: Secondary | ICD-10-CM | POA: Diagnosis not present

## 2018-01-13 ENCOUNTER — Ambulatory Visit
Admission: RE | Admit: 2018-01-13 | Discharge: 2018-01-13 | Disposition: A | Discharge status: Home / Self Care | Payer: Medicare Other | Source: Ambulatory Visit | Attending: Radiation Oncology | Admitting: Radiation Oncology

## 2018-01-13 DIAGNOSIS — Z510 Encounter for antineoplastic radiation therapy: Secondary | ICD-10-CM | POA: Diagnosis not present

## 2018-01-16 ENCOUNTER — Ambulatory Visit
Admission: RE | Admit: 2018-01-16 | Discharge: 2018-01-16 | Disposition: A | Discharge status: Home / Self Care | Payer: Medicare Other | Source: Ambulatory Visit | Attending: Radiation Oncology | Admitting: Radiation Oncology

## 2018-01-16 DIAGNOSIS — C3412 Malignant neoplasm of upper lobe, left bronchus or lung: Secondary | ICD-10-CM | POA: Diagnosis not present

## 2018-01-16 DIAGNOSIS — Z510 Encounter for antineoplastic radiation therapy: Secondary | ICD-10-CM | POA: Diagnosis present

## 2018-01-17 ENCOUNTER — Ambulatory Visit
Admission: RE | Admit: 2018-01-17 | Discharge: 2018-01-17 | Disposition: A | Discharge status: Home / Self Care | Payer: Medicare Other | Source: Ambulatory Visit | Attending: Radiation Oncology | Admitting: Radiation Oncology

## 2018-01-17 DIAGNOSIS — Z510 Encounter for antineoplastic radiation therapy: Secondary | ICD-10-CM | POA: Diagnosis not present

## 2018-01-18 ENCOUNTER — Ambulatory Visit
Admission: RE | Admit: 2018-01-18 | Discharge: 2018-01-18 | Disposition: A | Discharge status: Home / Self Care | Payer: Medicare Other | Source: Ambulatory Visit | Attending: Radiation Oncology | Admitting: Radiation Oncology

## 2018-01-18 ENCOUNTER — Other Ambulatory Visit: Payer: Self-pay

## 2018-01-18 ENCOUNTER — Inpatient Hospital Stay: Payer: Medicare Other | Attending: Radiation Oncology

## 2018-01-18 DIAGNOSIS — Z510 Encounter for antineoplastic radiation therapy: Secondary | ICD-10-CM | POA: Diagnosis not present

## 2018-01-18 DIAGNOSIS — C3412 Malignant neoplasm of upper lobe, left bronchus or lung: Secondary | ICD-10-CM

## 2018-01-18 LAB — CBC
HCT: 39.2 % (ref 35.0–47.0)
Hemoglobin: 13.7 g/dL (ref 12.0–16.0)
MCH: 39.2 pg — AB (ref 26.0–34.0)
MCHC: 35 g/dL (ref 32.0–36.0)
MCV: 111.8 fL — AB (ref 80.0–100.0)
PLATELETS: 149 10*3/uL — AB (ref 150–440)
RBC: 3.5 MIL/uL — AB (ref 3.80–5.20)
RDW: 14.4 % (ref 11.5–14.5)
WBC: 6.4 10*3/uL (ref 3.6–11.0)

## 2018-01-19 ENCOUNTER — Ambulatory Visit
Admission: RE | Admit: 2018-01-19 | Discharge: 2018-01-19 | Disposition: A | Discharge status: Home / Self Care | Payer: Medicare Other | Source: Ambulatory Visit | Attending: Radiation Oncology | Admitting: Radiation Oncology

## 2018-01-19 DIAGNOSIS — Z510 Encounter for antineoplastic radiation therapy: Secondary | ICD-10-CM | POA: Diagnosis not present

## 2018-01-20 ENCOUNTER — Ambulatory Visit
Admission: RE | Admit: 2018-01-20 | Discharge: 2018-01-20 | Disposition: A | Discharge status: Home / Self Care | Payer: Medicare Other | Source: Ambulatory Visit | Attending: Radiation Oncology | Admitting: Radiation Oncology

## 2018-01-20 DIAGNOSIS — Z510 Encounter for antineoplastic radiation therapy: Secondary | ICD-10-CM | POA: Diagnosis not present

## 2018-01-23 ENCOUNTER — Ambulatory Visit
Admission: RE | Admit: 2018-01-23 | Discharge: 2018-01-23 | Disposition: A | Discharge status: Home / Self Care | Payer: Medicare Other | Source: Ambulatory Visit | Attending: Radiation Oncology | Admitting: Radiation Oncology

## 2018-01-23 DIAGNOSIS — Z510 Encounter for antineoplastic radiation therapy: Secondary | ICD-10-CM | POA: Diagnosis not present

## 2018-01-24 ENCOUNTER — Ambulatory Visit
Admission: RE | Admit: 2018-01-24 | Discharge: 2018-01-24 | Disposition: A | Discharge status: Home / Self Care | Payer: Medicare Other | Source: Ambulatory Visit | Attending: Radiation Oncology | Admitting: Radiation Oncology

## 2018-01-24 DIAGNOSIS — Z510 Encounter for antineoplastic radiation therapy: Secondary | ICD-10-CM | POA: Diagnosis not present

## 2018-01-25 ENCOUNTER — Ambulatory Visit
Admission: RE | Admit: 2018-01-25 | Discharge: 2018-01-25 | Disposition: A | Discharge status: Home / Self Care | Payer: Medicare Other | Source: Ambulatory Visit | Attending: Radiation Oncology | Admitting: Radiation Oncology

## 2018-01-25 ENCOUNTER — Other Ambulatory Visit: Payer: Self-pay

## 2018-01-25 ENCOUNTER — Inpatient Hospital Stay: Payer: Medicare Other

## 2018-01-25 DIAGNOSIS — Z510 Encounter for antineoplastic radiation therapy: Secondary | ICD-10-CM | POA: Diagnosis not present

## 2018-01-25 DIAGNOSIS — C3412 Malignant neoplasm of upper lobe, left bronchus or lung: Secondary | ICD-10-CM | POA: Diagnosis not present

## 2018-01-25 LAB — CBC
HCT: 38.4 % (ref 35.0–47.0)
Hemoglobin: 13.6 g/dL (ref 12.0–16.0)
MCH: 39.1 pg — AB (ref 26.0–34.0)
MCHC: 35.3 g/dL (ref 32.0–36.0)
MCV: 110.9 fL — ABNORMAL HIGH (ref 80.0–100.0)
PLATELETS: 131 10*3/uL — AB (ref 150–440)
RBC: 3.46 MIL/uL — ABNORMAL LOW (ref 3.80–5.20)
RDW: 14.6 % — AB (ref 11.5–14.5)
WBC: 5.9 10*3/uL (ref 3.6–11.0)

## 2018-01-26 ENCOUNTER — Ambulatory Visit
Admission: RE | Admit: 2018-01-26 | Discharge: 2018-01-26 | Disposition: A | Discharge status: Home / Self Care | Payer: Medicare Other | Source: Ambulatory Visit | Attending: Radiation Oncology | Admitting: Radiation Oncology

## 2018-01-26 DIAGNOSIS — Z510 Encounter for antineoplastic radiation therapy: Secondary | ICD-10-CM | POA: Diagnosis not present

## 2018-01-27 ENCOUNTER — Ambulatory Visit
Admission: RE | Admit: 2018-01-27 | Discharge: 2018-01-27 | Disposition: A | Discharge status: Home / Self Care | Payer: Medicare Other | Source: Ambulatory Visit | Attending: Radiation Oncology | Admitting: Radiation Oncology

## 2018-01-27 DIAGNOSIS — Z510 Encounter for antineoplastic radiation therapy: Secondary | ICD-10-CM | POA: Diagnosis not present

## 2018-01-30 ENCOUNTER — Ambulatory Visit
Admission: RE | Admit: 2018-01-30 | Discharge: 2018-01-30 | Disposition: A | Discharge status: Home / Self Care | Payer: Medicare Other | Source: Ambulatory Visit | Attending: Radiation Oncology | Admitting: Radiation Oncology

## 2018-01-30 DIAGNOSIS — Z510 Encounter for antineoplastic radiation therapy: Secondary | ICD-10-CM | POA: Diagnosis not present

## 2018-02-15 ENCOUNTER — Inpatient Hospital Stay: Payer: Medicare Other | Admitting: Internal Medicine

## 2018-03-16 ENCOUNTER — Ambulatory Visit: Payer: Medicare Other | Admitting: Radiation Oncology

## 2018-03-22 ENCOUNTER — Ambulatory Visit: Payer: Medicare Other | Admitting: Radiation Oncology

## 2018-07-21 ENCOUNTER — Emergency Department: Payer: Medicare Other

## 2018-07-21 ENCOUNTER — Encounter: Payer: Self-pay | Admitting: Emergency Medicine

## 2018-07-21 ENCOUNTER — Other Ambulatory Visit: Payer: Self-pay

## 2018-07-21 ENCOUNTER — Emergency Department
Admission: EM | Admit: 2018-07-21 | Discharge: 2018-07-21 | Disposition: A | Discharge status: Home / Self Care | Payer: Medicare Other | Attending: Emergency Medicine | Admitting: Emergency Medicine

## 2018-07-21 DIAGNOSIS — Y939 Activity, unspecified: Secondary | ICD-10-CM | POA: Insufficient documentation

## 2018-07-21 DIAGNOSIS — Y929 Unspecified place or not applicable: Secondary | ICD-10-CM | POA: Insufficient documentation

## 2018-07-21 DIAGNOSIS — W19XXXA Unspecified fall, initial encounter: Secondary | ICD-10-CM | POA: Insufficient documentation

## 2018-07-21 DIAGNOSIS — C3492 Malignant neoplasm of unspecified part of left bronchus or lung: Secondary | ICD-10-CM | POA: Insufficient documentation

## 2018-07-21 DIAGNOSIS — R52 Pain, unspecified: Secondary | ICD-10-CM

## 2018-07-21 DIAGNOSIS — S42292A Other displaced fracture of upper end of left humerus, initial encounter for closed fracture: Secondary | ICD-10-CM

## 2018-07-21 DIAGNOSIS — Y92009 Unspecified place in unspecified non-institutional (private) residence as the place of occurrence of the external cause: Secondary | ICD-10-CM

## 2018-07-21 DIAGNOSIS — Z7982 Long term (current) use of aspirin: Secondary | ICD-10-CM | POA: Diagnosis not present

## 2018-07-21 DIAGNOSIS — Z79899 Other long term (current) drug therapy: Secondary | ICD-10-CM | POA: Diagnosis not present

## 2018-07-21 DIAGNOSIS — J449 Chronic obstructive pulmonary disease, unspecified: Secondary | ICD-10-CM | POA: Insufficient documentation

## 2018-07-21 DIAGNOSIS — F17210 Nicotine dependence, cigarettes, uncomplicated: Secondary | ICD-10-CM | POA: Insufficient documentation

## 2018-07-21 DIAGNOSIS — S4992XA Unspecified injury of left shoulder and upper arm, initial encounter: Secondary | ICD-10-CM | POA: Diagnosis present

## 2018-07-21 DIAGNOSIS — I10 Essential (primary) hypertension: Secondary | ICD-10-CM | POA: Insufficient documentation

## 2018-07-21 DIAGNOSIS — Y999 Unspecified external cause status: Secondary | ICD-10-CM | POA: Insufficient documentation

## 2018-07-21 DIAGNOSIS — E119 Type 2 diabetes mellitus without complications: Secondary | ICD-10-CM | POA: Diagnosis not present

## 2018-07-21 DIAGNOSIS — S42212A Unspecified displaced fracture of surgical neck of left humerus, initial encounter for closed fracture: Secondary | ICD-10-CM | POA: Insufficient documentation

## 2018-07-21 MED ORDER — TRAMADOL HCL 50 MG PO TABS: 50.0000 mg | ORAL_TABLET | Freq: Four times a day (QID) | ORAL | 0 refills | Status: DC | PRN

## 2018-07-21 NOTE — ED Provider Notes (Signed)
[facility] Emergency Department Provider Note  ____________________________________________  Time seen: Approximately 11:12 AM  I have reviewed the triage vital signs and the nursing notes.   HISTORY  Chief Complaint Fall    HPI Madeline Schmitt is a 72 y.o. female with a history of COPD diabetes hypertension lung cancer with current oncologic care goals of palliation who comes to the ED after a fall today.  She was in her usual state of health when she fell at about 4:00 AM today.  She fell onto her left side and has pain in the left lower back and left arm.  Denies any chest pain shortness of breath head injury or loss of consciousness.  She attributes the fall to losing her balance and denies any dizziness or blacking out.    Pain is sudden onset, nonradiating, severe, worse with arm movement, no alleviating factors    Past Medical History:  Diagnosis Date  . Aortic atherosclerosis ([place])   . Arrhythmia   . COPD (chronic obstructive pulmonary disease) ([place])   . Diabetes mellitus type 2, uncomplicated ([place])   . Essential hypertension   . History of pelvic fracture   . Hyperlipidemia, unspecified   . IBS (irritable bowel syndrome)   . Lung cancer ([place])   . Lung mass   . Osteoporosis   . Rheumatoid arthritis involving multiple sites with positive rheumatoid factor ([place])   . Secondary polycythemia      Patient Active Problem List   Diagnosis Date Noted  . Arrhythmia 01/07/2017  . COPD (chronic obstructive pulmonary disease) ([place]) 01/07/2017  . Diabetes mellitus type 2, uncomplicated ([place]) 16/05/9603  . Essential hypertension 01/07/2017  . Hyperlipidemia, unspecified 01/07/2017  . IBS (irritable bowel syndrome) 01/07/2017  . Macrocytosis without anemia 08/25/2016  . Thrombocytopenia ([place]) 08/25/2016  . Tobacco abuse 08/25/2016  . UTI (urinary tract infection) 08/25/2016  . Malignant neoplasm of upper lobe of left lung ([place]) 07/07/2016  . Mass  of upper lobe of left lung 06/22/2016  . Grief reaction 12/30/2015  . Secondary polycythemia 12/23/2014  . Osteoporosis 04/09/2014  . Rheumatoid arthritis involving multiple sites with positive rheumatoid factor ([place]) 04/09/2014     Past Surgical History:  Procedure Laterality Date  . BREAST BIOPSY Bilateral 1970   negative  . COLONOSCOPY     03/30/2002, 12/25/2004, 03/17/2007, 05/08/2010  . Resection of chronic olecranon bursitis    . S/P oral surgery       Prior to Admission medications   Medication Sig Start Date End Date Taking? Authorizing Provider  ADVAIR DISKUS 500-50 MCG/DOSE AEPB  10/15/16   [provider]  albuterol (PROAIR HFA) 108 (90 Base) MCG/ACT inhaler Inhale 2 puffs into the lungs every 6 (six) hours as needed for wheezing or shortness of breath.    [provider]  amLODipine (NORVASC) 2.5 MG tablet Take 2.5 mg by mouth daily.    [provider]  aspirin 81 MG tablet Take 81 mg by mouth daily.    [provider]  atenolol (TENORMIN) 50 MG tablet Take 100 mg by mouth daily.  06/29/16   [provider]  azelastine (ASTELIN) 0.1 % nasal spray Place into the nose. 12/28/17 12/28/18  [provider]  Cholecalciferol 1000 units tablet Take 1,000 Units by mouth daily.    [provider]  clonazePAM (KLONOPIN) 0.5 MG tablet Take 0.5 mg by mouth 2 (two) times daily as needed for anxiety.    [provider]  magic mouthwash  SOLN Take 5 mLs by mouth 4 (four) times daily as needed for mouth pain. 12/19/17   Noreene Filbert, MD  omeprazole (PRILOSEC) 20 MG capsule Take 20 mg by mouth daily as needed.     [provider]  ondansetron (ZOFRAN) 8 MG tablet Take 1 tablet (8 mg total) by mouth every 8 (eight) hours as needed for nausea or vomiting. 03/10/17   Noreene Filbert, MD  QUEtiapine (SEROQUEL) 25 MG tablet Take 25 mg by mouth at bedtime. 01/22/16   [provider]  sucralfate (CARAFATE) 1 g  tablet Take 1 tablet (1 g total) by mouth 3 (three) times daily. Dissolve in 3-4 tbsp warm water, swish and swallow. 01/11/18   Noreene Filbert, MD  traMADol (ULTRAM) 50 MG tablet Take 1 tablet (50 mg total) by mouth every 6 (six) hours as needed. 07/21/18   Carrie Mew, MD     Allergies Augmentin [amoxicillin-pot clavulanate]; Penicillins; Ace inhibitors; Actonel [risedronate sodium]; Celexa [citalopram hydrobromide]; Ciprofloxacin; Dicyclomine hcl; Hydrochlorothiazide w-triamterene; Maxzide [triamterene-hctz]; Nicoderm [nicotine]; Nsaids; Oxycodone; and Zyban [bupropion]   Family History  Problem Relation Age of Onset  . Lung cancer Brother   . Bone cancer Brother   . Breast cancer Neg Hx     Social History Social History   Tobacco Use  . Smoking status: Current Some Day Smoker    Packs/day: 0.50    Years: 55.00    Pack years: 27.50  . Smokeless tobacco: Never Used  Substance Use Topics  . Alcohol use: Yes    Alcohol/week: 14.0 standard drinks    Types: 14 Glasses of wine per week    Comment: 1-3 glasses of wine a day  . Drug use: No    Review of Systems  Constitutional:   No fever or chills.  ENT:   No sore throat. No rhinorrhea. Cardiovascular:   No chest pain or syncope. Respiratory:   No dyspnea or cough. Gastrointestinal:   Negative for abdominal pain, vomiting and diarrhea.  Musculoskeletal:   Left arm pain, left low back pain All other systems reviewed and are negative except as documented above in ROS and HPI.  ____________________________________________   PHYSICAL EXAM:  VITAL SIGNS: ED Triage Vitals  Enc Vitals Group     BP 07/21/18 1000 (!) 157/90     Pulse Rate 07/21/18 1000 99     Resp 07/21/18 1000 16     Temp 07/21/18 1000 97.9 F (36.6 C)     Temp Source 07/21/18 1000 Oral     SpO2 07/21/18 1000 96 %     Weight 07/21/18 0957 110 lb 10.7 oz (50.2 kg)     Height --      Head Circumference --      Peak Flow --      Pain Score 07/21/18  1000 8     Pain Loc --      Pain Edu? --      Excl. in [place]? --     Vital signs reviewed, nursing assessments reviewed.   Constitutional:   Alert and oriented.  Chronically ill-appearing, no acute distress Eyes:   Conjunctivae are normal. EOMI. PERRL. ENT      Head:   Normocephalic and atraumatic.      Nose:   No congestion/rhinnorhea.       Mouth/Throat:   MMM, no pharyngeal erythema. No peritonsillar mass.       Neck:   No meningismus. Full ROM. Hematological/Lymphatic/Immunilogical:   No cervical lymphadenopathy. Cardiovascular:  RRR. Symmetric bilateral radial and DP pulses.  No murmurs. Cap refill less than 2 seconds. Respiratory:   Normal respiratory effort without tachypnea/retractions.  Crackles at left lung base.   Gastrointestinal:   Soft and nontender. Non distended. There is no CVA tenderness.  No rebound, rigidity, or guarding.  Musculoskeletal:   Swelling and tenderness over the left proximal humerus with inability to lift the arm and pain with shoulder range of motion.  Otherwise no significant findings.  No midline spinal tenderness, pelvis stable and nontender. Neurologic:   Normal speech and language.  Motor grossly intact. No acute focal neurologic deficits are appreciated.  Skin:    Skin is warm, dry and intact. No rash noted.  No petechiae, purpura, or bullae.  ____________________________________________    LABS (pertinent positives/negatives) (all labs ordered are listed, but only abnormal results are displayed) Labs Reviewed - No data to display ____________________________________________   EKG  Interpreted by me Sinus rhythm rate of 95, left axis, normal intervals.  Poor R wave progression.  Normal ST segments and T waves.  ____________________________________________    RADIOLOGY  Dg Chest 1 View  Result Date: 07/21/2018 CLINICAL DATA:  Fall with LEFT chest pain. History of LEFT lung cancer. EXAM: CHEST  1 VIEW COMPARISON:  12/01/2017 PET CT  and 11/08/2017 chest CT FINDINGS: LEFT UPPER lobe mass appears increased in size from the prior CTs. Increasing LEFT hemithorax volume loss noted with small LEFT effusion noted. No pneumothorax. The RIGHT lung is clear. LEFT humeral neck fracture is present. Remote LEFT-sided rib fractures are identified. IMPRESSION: Apparent enlargement of LEFT UPPER lobe mass since prior CTs, with LEFT hemithorax volume loss and small LEFT pleural effusion. LEFT humeral neck fracture. Electronically Signed   By: Margarette Canada M.D.   On: 07/21/2018 10:58   Dg Pelvis 1-2 Views  Result Date: 07/21/2018 CLINICAL DATA:  Fall with hip pain.  Initial encounter. EXAM: PELVIS - 1-2 VIEW COMPARISON:  01/02/2010 radiographs FINDINGS: There is no evidence of acute pelvic fracture or diastasis. Remote RIGHT pubic fractures are again noted. No pelvic bone lesions are seen. IMPRESSION: No evidence of acute bony abnormality. Electronically Signed   By: Margarette Canada M.D.   On: 07/21/2018 11:00   Dg Humerus Left  Result Date: 07/21/2018 CLINICAL DATA:  Status post fall. EXAM: LEFT HUMERUS - 2+ VIEW COMPARISON:  None. FINDINGS: There is an impacted fracture of the left humeral neck with approximately 12 mm overlap of the fracture fragments. No significant angulation. Generalized osteopenia. Associated soft tissue swelling. IMPRESSION: Impacted fracture of the left humeral neck. Electronically Signed   By: Fidela Salisbury M.D.   On: 07/21/2018 10:53    ____________________________________________   PROCEDURES Procedures  ____________________________________________  DIFFERENTIAL DIAGNOSIS   Rib fracture, pneumothorax, proximal humerus fracture, pelvis fracture  CLINICAL IMPRESSION / ASSESSMENT AND PLAN / ED COURSE  Pertinent labs & imaging results that were available during my care of the patient were reviewed by me and considered in my medical decision making (see chart for details).    Patient presents with multiple  musculoskeletal complaints after a fall.  Fall sounds mechanical in nature.  No pulmonary complaints.  Will proceed with x-rays for now.  No head injury, no reason for CT of the head.  Clinical Course as of Jul 22 1111  Fri Jul 21, 2018  1106 X-ray shows impacted humeral neck fracture.  She is neurovascularly intact.  There is also a enlargement of left pulmonary mass which is chronic  and previously imaged.  Plan to place the patient in a sling, follow-up with orthopedics, pain control as needed.   [PS]    Clinical Course User Index [PS] Carrie Mew, MD    ----------------------------------------- 11:16 AM on 07/21/2018 -----------------------------------------  Due to the patient's chronically ill state and palliative goals of care with known recurrent lung cancer, I am treating her pain with tramadol which hopefully will be better tolerated than other opioids or NSAIDs and provide suitable relief of her pain in the acute phase.   ____________________________________________   FINAL CLINICAL IMPRESSION(S) / ED DIAGNOSES    Final diagnoses:  Fall in home, initial encounter  Closed fracture of anatomical neck of left humerus, initial encounter  Malignant neoplasm of left lung, unspecified part of lung ([place])  Chronic obstructive pulmonary disease, unspecified COPD type [facility]     ED Discharge Orders         Ordered    traMADol (ULTRAM) 50 MG tablet  Every 6 hours PRN     07/21/18 1112          Portions of this note were generated with dragon dictation software. Dictation errors may occur despite best attempts at proofreading.    Carrie Mew, MD 07/21/18 [phone]

## 2018-07-21 NOTE — ED Notes (Signed)
Pt returned from x-ray. Pt family at bedside

## 2018-07-21 NOTE — Care Management Note (Signed)
Case Management Note  Patient Details  Name: Madeline Schmitt MRN: [id number] Date of Birth: 22-Aug-1945  Subjective/Objective:     Patient is being seen in the ED after a fall this morning.  Patient reports that she woke up at 4 am and needed to go to the bathroom and she just fell.  She reports that she did not get dizzy and did not trip she just fell.  Patient lives alone in [place], her daughter [name] lives in [place] also and is available if her mother needs her for assistance.  Patient reports that she is independent in ADL's, she has a cane with her in the ED but reports she also has a rollator at home that she has not been using.  Patient reports she also has a 3 in 1, and grab bars in the bathroom.  PT is working with patient and recommends [facility] and OT.  Patient reports that she does drive but she will not be able to again until after the sling comes off her arm.  PCP is verified as Dr. Ramonita Lab, she sees him every 3 months.  Patient states that she will follow up with her orthopedic doctor after discharged from the ED.  Choice offered for [facility] and patient chooses [facility]- she states she has had them in the past and really liked them.  Floydene Flock with [facility] notified and will set up services.               Doran Clay RN BSN Care Management [phone]  Action/Plan: Discharge home with [facility] PT and OT.  Expected Discharge Date:                  Expected Discharge Plan:  [place]  In-House Referral:     Discharge planning Services  CM Consult  Post Acute Care Choice:  Home Health Choice offered to:  Patient  DME Arranged:    DME Agency:     HH Arranged:  PT, OT HH Agency:  [facility]  Status of Service:  Completed, signed off  If discussed at [place] of Stay Meetings, dates discussed:    Additional Comments:  Shelbie Hutching, RN 07/21/2018, 1:29 PM

## 2018-07-21 NOTE — Evaluation (Addendum)
Physical Therapy Evaluation Patient Details Name: Madeline Schmitt MRN: [id number] DOB: November 14, 1945 Today's Date: 07/21/2018   History of Present Illness  72 y.o. female with a history of COPD diabetes, RA hypertension lung cancer with current oncologic care goals of palliation who comes to the ED after a fall today. Xray showed Impacted fracture of the left humeral neck.  Clinical Impression  Patient with mod complaints of pain with L shoulder movement. Sling adjusted for provide more support, pt reported improvement. Pt reported that she lives alone in an apartment with level entry, and family is available PRN/intermittently for assistance as needed. Previously mod I for ambulation [facility] or rollator as needed), mod I for ADLs, still drives (grocery pickup). Pt reported 1 fall that led to this admission.   Patient demonstrated bed mobility mod I, and transferred with CGA with SPC/no AD, supervision with quad cane. Ambulated a total of 16ft with varying AD, safest/steady gait with quad cane. Pt demonstrated deficits with higher level balance testing, as well as decreased activity tolerance/endurance (needed sitting breaks during ambulation trials). The patient would benefit from further skilled PT to address limitations (see "PT Problem List") to maximize safety, mobility, and independence. Pt may benefit from quad cane due to increased safety with mobility with use/inability to use rollator due to LUE precautions. Recommendation of HHPT and OT consult for ADL and sling education.  Pt also educated about potential benefit of staying with family members/an aide on return home for supervision/assistance for ADLs due to limitations of LUE, resistant to the need of assistance.     Follow Up Recommendations Home health PT    Equipment Recommendations  Other (comment)( quad cane)    Recommendations for Other Services OT consult     Precautions / Restrictions Precautions Precautions:  Fall;Shoulder Type of Shoulder Precautions: sling Shoulder Interventions: Shoulder sling/immobilizer Precaution Booklet Issued: No Required Braces or Orthoses: Sling      Mobility  Bed Mobility Overal bed mobility: Modified Independent                Transfers Overall transfer level: Needs assistance Equipment used: None;Quad cane Transfers: Sit to/from Stand Sit to Stand: Supervision;Min guard         General transfer comment: supervision with use of quad cane, CGA for none/SPC  Ambulation/Gait Ambulation/Gait assistance: Min guard;Supervision   Assistive device: Straight cane;Quad cane;None   Gait velocity: decreased in all three conditions (none, SPC, quad)   General Gait Details: Improved gait velocity and steadiness with AD, quad > SPC, pt reported feeling steadier with quad cane as well. No LOB noted.   Stairs            Wheelchair Mobility    Modified Rankin (Stroke Patients Only)       Balance Overall balance assessment: Needs assistance Sitting-balance support: Feet supported Sitting balance-Leahy Scale: Good     Standing balance support: Single extremity supported Standing balance-Leahy Scale: Fair   Single Leg Stance - Right Leg: 2 Single Leg Stance - Left Leg: 2 Tandem Stance - Right Leg: 10 Tandem Stance - Left Leg: 12   [place] - Eyes Closed: 15 High level balance activites: Direction changes;Sudden stops;Head turns High Level Balance Comments: Pt with increased unsteadiness with higher level balance assessment. Unable to perform SLS without support. Gait velocity changes with head turns and direction changes.              Pertinent Vitals/Pain Pain Assessment: Faces Faces Pain Scale: Hurts little more Pain  Location: L arm Pain Descriptors / Indicators: Discomfort;Grimacing Pain Intervention(s): Limited activity within patient's tolerance;Monitored during session;Repositioned    Home Living Family/patient expects to be  discharged to:: Private residence Living Arrangements: Alone Available Help at Discharge: Family;Available PRN/intermittently Type of Home: Apartment Home Access: Level entry     Home Layout: One level Home Equipment: Walker - 4 wheels;Bedside commode;Grab bars - tub/shower;Cane - single point      Prior Function Level of Independence: Independent with assistive device(s)         Comments: mod I with rollator/SPC as needed per patient. Family assists with IADLs, pt does endorse that she drives (pick up services for groceries)     Hand Dominance   Dominant Hand: Right    Extremity/Trunk Assessment   Upper Extremity Assessment Upper Extremity Assessment: Generalized weakness;LUE deficits/detail LUE: Unable to fully assess due to immobilization    Lower Extremity Assessment Lower Extremity Assessment: Generalized weakness       Communication   Communication: Other (comment)(soft spoken)  Cognition Arousal/Alertness: Awake/alert Behavior During Therapy: WFL for tasks assessed/performed Overall Cognitive Status: Within Functional Limits for tasks assessed                                        General Comments      Exercises     Assessment/Plan    PT Assessment Patient needs continued PT services  PT Problem List Decreased strength;Decreased range of motion;Decreased knowledge of use of DME;Decreased activity tolerance;Decreased balance;Decreased mobility;Pain       PT Treatment Interventions DME instruction;Balance training;Gait training;Neuromuscular re-education;Stair training;Functional mobility training;Patient/family education;Therapeutic activities;Therapeutic exercise    PT Goals (Current goals can be found in the Care Plan section)  Acute Rehab PT Goals Patient Stated Goal: to go home PT Goal Formulation: With patient Time For Goal Achievement: 08/04/18 Potential to Achieve Goals: Good    Frequency Min 2X/week   Barriers to  discharge        Co-evaluation               AM-PAC PT "6 Clicks" Mobility  Outcome Measure Help needed turning from your back to your side while in a flat bed without using bedrails?: None Help needed moving from lying on your back to sitting on the side of a flat bed without using bedrails?: None Help needed moving to and from a bed to a chair (including a wheelchair)?: None Help needed standing up from a chair using your arms (e.g., wheelchair or bedside chair)?: A Little Help needed to walk in hospital room?: A Little Help needed climbing 3-5 steps with a railing? : A Little 6 Click Score: 21    End of Session Equipment Utilized During Treatment: Gait belt Activity Tolerance: Patient limited by fatigue Patient left: in bed;with family/visitor present Nurse Communication: Mobility status PT Visit Diagnosis: Unsteadiness on feet (R26.81);History of falling (Z91.81);Difficulty in walking, not elsewhere classified (R26.2);Pain Pain - Right/Left: Left Pain - part of body: Shoulder    Time: 3212-2482 PT Time Calculation (min) (ACUTE ONLY): 24 min   Charges:   PT Evaluation $PT Eval Low Complexity: 1 Low PT Treatments $Therapeutic Activity: 8-22 mins        Lieutenant Diego PT, DPT [phone] PM,07/21/18 [phone]

## 2018-07-21 NOTE — ED Notes (Signed)
Sling placed on left arm. Pt up to toilet to urinate at this time. Pt steady on feet and not complaining of pain with sling in place.

## 2018-07-21 NOTE — ED Triage Notes (Signed)
Pt to ED via [facility] from home for fall around 0400 this morning. Pt was able to get herself up out of the floor but has had worsening of the pain in her lower back and left arm. Pts initial O2 sat was 84% on room air, pt has hx/o COPD and has PRN O2 at home, which she did not have on at the time. Pts O2 sat increased to 98% when placed on 2 liter via [place]. Pt denies hitting head or LOC. Pt is in NAD at this time.

## 2018-07-21 NOTE — ED Notes (Signed)
Pt declines pain medication at this time.

## 2018-07-21 NOTE — Discharge Instructions (Signed)
Your arm x-ray shows a fracture in the upper arm near the shoulder.  Wear the sling at all times for comfort and follow-up with orthopedics next week.  Take tramadol as needed for pain control.

## 2018-07-21 NOTE — Progress Notes (Signed)
LCSW called ED secretary and EDP Dr Joni Fears to advise them this is a care management consult not a social work. They were advised to change it Care management Pryor Montes [phone] I texted her for [facility] [place] LCSW [phone]

## 2018-07-27 ENCOUNTER — Emergency Department: Payer: Medicare Other

## 2018-07-27 ENCOUNTER — Encounter: Payer: Self-pay | Admitting: Emergency Medicine

## 2018-07-27 ENCOUNTER — Other Ambulatory Visit: Payer: Self-pay

## 2018-07-27 ENCOUNTER — Emergency Department
Admission: EM | Admit: 2018-07-27 | Discharge: 2018-07-28 | Disposition: A | Discharge status: Home / Self Care | Payer: Medicare Other | Source: Home / Self Care | Attending: Student in an Organized Health Care Education/Training Program | Admitting: Student in an Organized Health Care Education/Training Program

## 2018-07-27 DIAGNOSIS — R05 Cough: Secondary | ICD-10-CM | POA: Insufficient documentation

## 2018-07-27 DIAGNOSIS — Z7982 Long term (current) use of aspirin: Secondary | ICD-10-CM | POA: Insufficient documentation

## 2018-07-27 DIAGNOSIS — J441 Chronic obstructive pulmonary disease with (acute) exacerbation: Secondary | ICD-10-CM | POA: Diagnosis not present

## 2018-07-27 DIAGNOSIS — R059 Cough, unspecified: Secondary | ICD-10-CM

## 2018-07-27 DIAGNOSIS — F17200 Nicotine dependence, unspecified, uncomplicated: Secondary | ICD-10-CM

## 2018-07-27 DIAGNOSIS — I10 Essential (primary) hypertension: Secondary | ICD-10-CM | POA: Insufficient documentation

## 2018-07-27 DIAGNOSIS — R531 Weakness: Secondary | ICD-10-CM

## 2018-07-27 DIAGNOSIS — R0602 Shortness of breath: Secondary | ICD-10-CM | POA: Diagnosis not present

## 2018-07-27 DIAGNOSIS — E871 Hypo-osmolality and hyponatremia: Secondary | ICD-10-CM

## 2018-07-27 DIAGNOSIS — E119 Type 2 diabetes mellitus without complications: Secondary | ICD-10-CM | POA: Insufficient documentation

## 2018-07-27 DIAGNOSIS — J449 Chronic obstructive pulmonary disease, unspecified: Secondary | ICD-10-CM

## 2018-07-27 DIAGNOSIS — C3412 Malignant neoplasm of upper lobe, left bronchus or lung: Secondary | ICD-10-CM | POA: Insufficient documentation

## 2018-07-27 DIAGNOSIS — Z79899 Other long term (current) drug therapy: Secondary | ICD-10-CM | POA: Insufficient documentation

## 2018-07-27 LAB — BASIC METABOLIC PANEL
Anion gap: 8 (ref 5–15)
BUN: 11 mg/dL (ref 8–23)
CO2: 29 mmol/L (ref 22–32)
CREATININE: 0.46 mg/dL (ref 0.44–1.00)
Calcium: 8.6 mg/dL — ABNORMAL LOW (ref 8.9–10.3)
Chloride: 89 mmol/L — ABNORMAL LOW (ref 98–111)
GFR calc Af Amer: >60 mL/min (ref >60)
GFR calc non Af Amer: >60 mL/min (ref >60)
Glucose, Bld: 146 mg/dL — ABNORMAL HIGH (ref 70–99)
Potassium: 3.6 mmol/L (ref 3.5–5.1)
Sodium: 126 mmol/L — ABNORMAL LOW (ref 135–145)

## 2018-07-27 LAB — HEPATIC FUNCTION PANEL
ALBUMIN: 2.5 g/dL — AB (ref 3.5–5.0)
ALT: 12 U/L (ref 0–44)
AST: 18 U/L (ref 15–41)
Alkaline Phosphatase: 107 U/L (ref 38–126)
Bilirubin, Direct: 0.2 mg/dL (ref 0.0–0.2)
Indirect Bilirubin: 0.7 mg/dL (ref 0.3–0.9)
Total Bilirubin: 0.9 mg/dL (ref 0.3–1.2)
Total Protein: 7.3 g/dL (ref 6.5–8.1)

## 2018-07-27 LAB — CBC
HCT: 34.4 % — ABNORMAL LOW (ref 36.0–46.0)
Hemoglobin: 11.5 g/dL — ABNORMAL LOW (ref 12.0–15.0)
MCH: 36.7 pg — ABNORMAL HIGH (ref 26.0–34.0)
MCHC: 33.4 g/dL (ref 30.0–36.0)
MCV: 109.9 fL — ABNORMAL HIGH (ref 80.0–100.0)
NRBC: 0 % (ref 0.0–0.2)
Platelets: 218 10*3/uL (ref 150–400)
RBC: 3.13 MIL/uL — ABNORMAL LOW (ref 3.87–5.11)
RDW: 14.5 % (ref 11.5–15.5)
WBC: 13.6 10*3/uL — ABNORMAL HIGH (ref 4.0–10.5)

## 2018-07-27 LAB — URINALYSIS, COMPLETE (UACMP) WITH MICROSCOPIC
Bilirubin Urine: NEGATIVE
Glucose, UA: 150 mg/dL — AB
Ketones, ur: NEGATIVE mg/dL
Nitrite: NEGATIVE
PROTEIN: 100 mg/dL — AB
Specific Gravity, Urine: 1.024 (ref 1.005–1.030)
WBC, UA: >50 WBC/hpf — ABNORMAL HIGH (ref 0–5)
pH: 5 (ref 5.0–8.0)

## 2018-07-27 MED ORDER — FOSFOMYCIN TROMETHAMINE 3 G PO PACK
3.0000 g | PACK | Freq: Once | ORAL | Status: AC
  Administered 2018-07-27: 3 g via ORAL
  Filled 2018-07-27: qty 3

## 2018-07-27 MED ORDER — DOXYCYCLINE HYCLATE 100 MG PO TABS: 100.0000 mg | ORAL_TABLET | Freq: Two times a day (BID) | ORAL | 0 refills | Status: DC

## 2018-07-27 MED ORDER — SODIUM CHLORIDE 0.9 % IV BOLUS
250.0000 mL | Freq: Once | INTRAVENOUS | Status: AC
  Administered 2018-07-27: 250 mL via INTRAVENOUS

## 2018-07-27 MED ORDER — DOXYCYCLINE HYCLATE 100 MG PO TABS
100.0000 mg | ORAL_TABLET | Freq: Once | ORAL | Status: AC
  Administered 2018-07-27: 100 mg via ORAL
  Filled 2018-07-27: qty 1

## 2018-07-27 NOTE — Progress Notes (Signed)
Received a call from Doran Clay in the ED.  New referral for in home palliative care consult on Madeline Schmitt.  She will be discharging home from the ED today.  Information for referral faxed to referral intake.  Notified patient's family that our agency would call once order received for services from Dr. Caryl Comes.  Verbalized understanding.  Thank you for allowing participation in this patient's care.  Dimas Aguas, RN Clinical Nurse Liaison [phone] [facility]

## 2018-07-27 NOTE — Progress Notes (Signed)
LCSW consulted with EDP and Care Manager> This is not a SW consult but Care Manager will see. LCSW will support and follow care manager if level of care changes for patient.  [employer] LCSW [phone]

## 2018-07-27 NOTE — ED Notes (Signed)
Case manager in with pt

## 2018-07-27 NOTE — ED Provider Notes (Signed)
[facility] Emergency Department Provider Note    First MD Initiated Contact with Patient 07/27/18 1144     (approximate)  I have reviewed the triage vital signs and the nursing notes.   HISTORY  Chief Complaint Follow-up    HPI Madeline Schmitt is a 73 y.o. female presents the ER with chief complaint of weakness and decreased appetite.  Patient with extensive past medical history with known lung cancer with recent fall with left humerus fracture presenting from home.  Patient was being checked out by home health aide.  She denies any complaints.  Does wear home oxygen.  States she has been having productive cough but this is not bothering her.    Past Medical History:  Diagnosis Date  . Aortic atherosclerosis ([place])   . Arrhythmia   . COPD (chronic obstructive pulmonary disease) ([place])   . Diabetes mellitus type 2, uncomplicated ([place])   . Essential hypertension   . History of pelvic fracture   . Hyperlipidemia, unspecified   . IBS (irritable bowel syndrome)   . Lung cancer ([place])   . Lung mass   . Osteoporosis   . Rheumatoid arthritis involving multiple sites with positive rheumatoid factor ([place])   . Secondary polycythemia    Family History  Problem Relation Age of Onset  . Lung cancer Brother   . Bone cancer Brother   . Breast cancer Neg Hx    Past Surgical History:  Procedure Laterality Date  . BREAST BIOPSY Bilateral 1970   negative  . COLONOSCOPY     03/30/2002, 12/25/2004, 03/17/2007, 05/08/2010  . Resection of chronic olecranon bursitis    . S/P oral surgery     Patient Active Problem List   Diagnosis Date Noted  . Arrhythmia 01/07/2017  . COPD (chronic obstructive pulmonary disease) ([place]) 01/07/2017  . Diabetes mellitus type 2, uncomplicated ([place]) 16/57/9038  . Essential hypertension 01/07/2017  . Hyperlipidemia, unspecified 01/07/2017  . IBS (irritable bowel syndrome) 01/07/2017  . Macrocytosis without anemia 08/25/2016  .  Thrombocytopenia ([place]) 08/25/2016  . Tobacco abuse 08/25/2016  . UTI (urinary tract infection) 08/25/2016  . Malignant neoplasm of upper lobe of left lung ([place]) 07/07/2016  . Mass of upper lobe of left lung 06/22/2016  . Grief reaction 12/30/2015  . Secondary polycythemia 12/23/2014  . Osteoporosis 04/09/2014  . Rheumatoid arthritis involving multiple sites with positive rheumatoid factor ([place]) 04/09/2014      Prior to Admission medications   Medication Sig Start Date End Date Taking? Authorizing Provider  ADVAIR DISKUS 500-50 MCG/DOSE AEPB  10/15/16  Yes [provider]  albuterol (PROAIR HFA) 108 (90 Base) MCG/ACT inhaler Inhale 2 puffs into the lungs every 6 (six) hours as needed for wheezing or shortness of breath.   Yes [provider]  amLODipine (NORVASC) 2.5 MG tablet Take 2.5 mg by mouth daily.   Yes [provider]  aspirin 81 MG tablet Take 81 mg by mouth daily.   Yes [provider]  atenolol (TENORMIN) 50 MG tablet Take 100 mg by mouth daily.  06/29/16  Yes [provider]  Cholecalciferol 1000 units tablet Take 1,000 Units by mouth daily.   Yes [provider]  clonazePAM (KLONOPIN) 0.5 MG tablet Take 0.5 mg by mouth 2 (two) times daily as needed for anxiety.   Yes [provider]  mirtazapine (REMERON) 30 MG tablet Take 30 mg by mouth at bedtime.   Yes [provider]  pantoprazole (PROTONIX) 20 MG tablet  Take 20 mg by mouth at bedtime.   Yes [provider]  QUEtiapine (SEROQUEL) 25 MG tablet Take 25 mg by mouth at bedtime. 01/22/16  Yes [provider]  azelastine (ASTELIN) 0.1 % nasal spray Place into the nose. 12/28/17 12/28/18  [provider]  doxycycline (VIBRA-TABS) 100 MG tablet Take 1 tablet (100 mg total) by mouth 2 (two) times daily for 7 days. 07/27/18 08/03/18  Merlyn Lot, MD  magic mouthwash SOLN Take 5 mLs by mouth 4 (four) times daily as needed for mouth  pain. Patient not taking: Reported on 07/27/2018 12/19/17   Noreene Filbert, MD  omeprazole (PRILOSEC) 20 MG capsule Take 20 mg by mouth daily as needed.     [provider]  ondansetron (ZOFRAN) 8 MG tablet Take 1 tablet (8 mg total) by mouth every 8 (eight) hours as needed for nausea or vomiting. Patient not taking: Reported on 07/27/2018 03/10/17   Noreene Filbert, MD  sucralfate (CARAFATE) 1 g tablet Take 1 tablet (1 g total) by mouth 3 (three) times daily. Dissolve in 3-4 tbsp warm water, swish and swallow. Patient not taking: Reported on 07/27/2018 01/11/18   Noreene Filbert, MD  traMADol (ULTRAM) 50 MG tablet Take 1 tablet (50 mg total) by mouth every 6 (six) hours as needed. Patient not taking: Reported on 07/27/2018 07/21/18   Carrie Mew, MD    Allergies Augmentin [amoxicillin-pot clavulanate]; Penicillins; Ace inhibitors; Actonel [risedronate sodium]; Celexa [citalopram hydrobromide]; Ciprofloxacin; Dicyclomine hcl; Hydrochlorothiazide w-triamterene; Maxzide [triamterene-hctz]; Nicoderm [nicotine]; Nsaids; Oxycodone; and Zyban [bupropion]    Social History Social History   Tobacco Use  . Smoking status: Current Some Day Smoker    Packs/day: 0.50    Years: 55.00    Pack years: 27.50  . Smokeless tobacco: Never Used  Substance Use Topics  . Alcohol use: Yes    Alcohol/week: 14.0 standard drinks    Types: 14 Glasses of wine per week    Comment: 1-3 glasses of wine a day  . Drug use: No    Review of Systems Patient denies headaches, rhinorrhea, blurry vision, numbness, shortness of breath, chest pain, edema, cough, abdominal pain, nausea, vomiting, diarrhea, dysuria, fevers, rashes or hallucinations unless otherwise stated above in HPI. ____________________________________________   PHYSICAL EXAM:  VITAL SIGNS: Vitals:   07/27/18 1406 07/27/18 1430  BP: 111/65 115/61  Pulse: 73 90  Resp: 20 19  Temp:    SpO2: 95% 93%    Constitutional: Alert and  oriented.  Eyes: Conjunctivae are normal.  Head: Atraumatic. Nose: No congestion/rhinnorhea. Mouth/Throat: Mucous membranes are moist.   Neck: No stridor. Painless ROM.  Cardiovascular: Normal rate, regular rhythm. Grossly normal heart sounds.  Good peripheral circulation. Respiratory: Normal respiratory effort.  No retractions. Lungs with rhonchorus breathsounds in left lung fields Gastrointestinal: Soft and nontender. No distention. No abdominal bruits. No CVA tenderness. Genitourinary:  Musculoskeletal: No lower extremity tenderness nor edema.  No joint effusions. Neurologic:  Normal speech and language. No gross focal neurologic deficits are appreciated. No facial droop Skin:  Skin is warm, dry and intact. No rash noted. Psychiatric: Mood and affect are normal. Speech and behavior are normal.  ____________________________________________   LABS (all labs ordered are listed, but only abnormal results are displayed)  Results for orders placed or performed during the hospital encounter of 07/27/18 (from the past 24 hour(s))  Basic metabolic panel     Status: Abnormal   Collection Time: 07/27/18 11:46 AM  Result Value Ref Range   Sodium  126 (L) 135 - 145 mmol/L   Potassium 3.6 3.5 - 5.1 mmol/L   Chloride 89 (L) 98 - 111 mmol/L   CO2 29 22 - 32 mmol/L   Glucose, Bld 146 (H) 70 - 99 mg/dL   BUN 11 8 - 23 mg/dL   Creatinine, Ser 0.46 0.44 - 1.00 mg/dL   Calcium 8.6 (L) 8.9 - 10.3 mg/dL   GFR calc non Af Amer >60 >60 mL/min   GFR calc Af Amer >60 >60 mL/min   Anion gap 8 5 - 15  CBC     Status: Abnormal   Collection Time: 07/27/18 11:46 AM  Result Value Ref Range   WBC 13.6 (H) 4.0 - 10.5 K/uL   RBC 3.13 (L) 3.87 - 5.11 MIL/uL   Hemoglobin 11.5 (L) 12.0 - 15.0 g/dL   HCT 34.4 (L) 36.0 - 46.0 %   MCV 109.9 (H) 80.0 - 100.0 fL   MCH 36.7 (H) 26.0 - 34.0 pg   MCHC 33.4 30.0 - 36.0 g/dL   RDW 14.5 11.5 - 15.5 %   Platelets 218 150 - 400 K/uL   nRBC 0.0 0.0 - 0.2 %  Hepatic  function panel     Status: Abnormal   Collection Time: 07/27/18 11:56 AM  Result Value Ref Range   Total Protein 7.3 6.5 - 8.1 g/dL   Albumin 2.5 (L) 3.5 - 5.0 g/dL   AST 18 15 - 41 U/L   ALT 12 0 - 44 U/L   Alkaline Phosphatase 107 38 - 126 U/L   Total Bilirubin 0.9 0.3 - 1.2 mg/dL   Bilirubin, Direct 0.2 0.0 - 0.2 mg/dL   Indirect Bilirubin 0.7 0.3 - 0.9 mg/dL  Urinalysis, Complete w Microscopic     Status: Abnormal   Collection Time: 07/27/18  3:38 PM  Result Value Ref Range   Color, Urine AMBER (A) YELLOW   APPearance CLOUDY (A) CLEAR   Specific Gravity, Urine 1.024 1.005 - 1.030   pH 5.0 5.0 - 8.0   Glucose, UA 150 (A) NEGATIVE mg/dL   Hgb urine dipstick LARGE (A) NEGATIVE   Bilirubin Urine NEGATIVE NEGATIVE   Ketones, ur NEGATIVE NEGATIVE mg/dL   Protein, ur 100 (A) NEGATIVE mg/dL   Nitrite NEGATIVE NEGATIVE   Leukocytes, UA MODERATE (A) NEGATIVE   RBC / HPF 21-50 0 - 5 RBC/hpf   WBC, UA >50 (H) 0 - 5 WBC/hpf   Bacteria, UA MANY (A) NONE SEEN   Squamous Epithelial / LPF 11-20 0 - 5   WBC Clumps PRESENT    Mucus PRESENT    ____________________________________________  EKG My review and personal interpretation at Time: 11:52   Indication: weakness  Rate: 90  Rhythm: sinus Axis: left Other: poor r wave progression, no stemi, occasional pvc ____________________________________________  RADIOLOGY  I personally reviewed all radiographic images ordered to evaluate for the above acute complaints and reviewed radiology reports and findings.  These findings were personally discussed with the patient.  Please see medical record for radiology report.  ____________________________________________   PROCEDURES  Procedure(s) performed:  Procedures    Critical Care performed: no ____________________________________________   INITIAL IMPRESSION / ASSESSMENT AND PLAN / ED COURSE  Pertinent labs & imaging results that were available during my care of the patient were  reviewed by me and considered in my medical decision making (see chart for details).   DDX: Dehydration, sepsis, pna, uti, hypoglycemia, drug effect,    Eulala Newcombe Haapala is a 72 y.o. who  presents to the ED with presentation as described above.  Patient asymptomatic not having any complaints but does have a productive sounding cough.  No new hypoxia above her 2 L nasal cannula.  Patient already requesting discharge home.  Will order blood work and radiographs.  Patient agreeable to assessment.  Does have extensive past medical history with terminal lung cancer but not currently on hospice.  The patient will be placed on continuous pulse oximetry and telemetry for monitoring.  Laboratory evaluation will be sent to evaluate for the above complaints.     Clinical Course as of Jul 27 1616  Thu Jul 27, 2018  1326 Blood work roughly at baseline.  Does have mild hyponatremia.  Will give little IV hydration.  Patient states that she would prefer to go home does not want any life prolonging measures that she is aware of her terminal illness.  Denies any symptoms.  Previously was refusing home health services but agreeable to being evaluated for additional home health services at this time.   [PR]  1508 CM spoke with patient and she currently does not want home health or hospice care.  She is requesting discharge home.  Does appear that she is understanding the terminal nature of her disease will prefer to spend as much time at home as possible.  Heart rate improved after IV hydration.  Discussed low-sodium but this also appears to be at her baseline.  No fevers no evidence of pneumonia.   [PR]  1608 Patient with pyuria.  States that she has had many previously but has been asymptomatic.  Denies any dysuria or urinary frequency.  We will give her dose of fosfomycin.  Will continue with doxycycline.  Patient again requesting discharge home.  Discussed option for admission the hospital for further medical  management.  This was witnessed by family member at bedside.  She patient states she is adamant and wants to spend as much time at home as possible which I think is reasonable.  We will work on getting home health palliative care arranged at home.   We discussed signs and symptoms for which the patient should return to the ER.   [PR]    Clinical Course User Index [PR] Merlyn Lot, MD     As part of my medical decision making, I reviewed the following data within the [facility] notes reviewed and incorporated, Labs reviewed, notes from prior ED visits.   ____________________________________________   FINAL CLINICAL IMPRESSION(S) / ED DIAGNOSES  Final diagnoses:  Weakness  Hyponatremia  Cough      NEW MEDICATIONS STARTED DURING THIS VISIT:  New Prescriptions   DOXYCYCLINE (VIBRA-TABS) 100 MG TABLET    Take 1 tablet (100 mg total) by mouth 2 (two) times daily for 7 days.     Note:  This document was prepared using Dragon voice recognition software and may include unintentional dictation errors.    Merlyn Lot, MD 07/27/18 [phone]

## 2018-07-27 NOTE — Discharge Instructions (Addendum)
Please follow-up with PCP.  Return to the ER if you have any additional questions or concerns.

## 2018-07-27 NOTE — ED Triage Notes (Signed)
Pt sent in by home health nurse for check up. Pt has congestion cough x 3 weeks with hx of lung ca. Broken arm since Friday and is scheduled to have a cast placed. Pt has no complaints except being hungry. A&ox3

## 2018-07-27 NOTE — Care Management Note (Signed)
Case Management Note  Patient Details  Name: Madeline Schmitt MRN: [id number] Date of Birth: March 02, 1946  Subjective/Objective:   Patient is being seen in the ED for lung congestion and decreased O2 sats.  Patient does not want to be admitted, she adamantly wants to go home.  Patient has been having a hard time at home with ADL's with her arm in a sling and reports becoming weaker.  HH arranged with [facility] from last ER visit.  Floydene Flock with [facility] notified of discharge and continued home health services.  Palliative will follow patient at home, [place] with [facility] and Palliative Care will contact Dr. Caryn Section for orders and set up Palliative at home. Patient has oxygen at home from [place].  Daughter is with patient and will assist at home as she is able.                      Action/Plan: Continue home health services, PT, OT, aide, and SW.  Quad cane and 3 in 1 ordered [place] with [facility] to get for patient before discharge.    Expected Discharge Date:                  Expected Discharge Plan:  [place]  In-House Referral:     Discharge planning Services  CM Consult  Post Acute Care Choice:    Choice offered to:     DME Arranged:  3-N-1, [place] DME Agency:  [place]:    [place]:  [place]  Status of Service:  Completed, signed off  If discussed at [place] of Stay Meetings, dates discussed:    Additional Comments:  Shelbie Hutching, RN 07/27/2018, 3:15 PM

## 2018-07-31 ENCOUNTER — Inpatient Hospital Stay
Admission: EM | Admit: 2018-07-31 | Discharge: 2018-08-03 | DRG: 190 | Disposition: A | Discharge status: Skilled Nursing Facility | Payer: Medicare Other | Attending: Internal Medicine | Admitting: Internal Medicine

## 2018-07-31 ENCOUNTER — Other Ambulatory Visit: Payer: Self-pay

## 2018-07-31 ENCOUNTER — Emergency Department: Payer: Medicare Other

## 2018-07-31 DIAGNOSIS — Z66 Do not resuscitate: Secondary | ICD-10-CM | POA: Diagnosis present

## 2018-07-31 DIAGNOSIS — M810 Age-related osteoporosis without current pathological fracture: Secondary | ICD-10-CM | POA: Diagnosis present

## 2018-07-31 DIAGNOSIS — W19XXXD Unspecified fall, subsequent encounter: Secondary | ICD-10-CM | POA: Diagnosis present

## 2018-07-31 DIAGNOSIS — Z808 Family history of malignant neoplasm of other organs or systems: Secondary | ICD-10-CM | POA: Diagnosis not present

## 2018-07-31 DIAGNOSIS — S42302D Unspecified fracture of shaft of humerus, left arm, subsequent encounter for fracture with routine healing: Secondary | ICD-10-CM | POA: Diagnosis not present

## 2018-07-31 DIAGNOSIS — Z681 Body mass index (BMI) 19 or less, adult: Secondary | ICD-10-CM

## 2018-07-31 DIAGNOSIS — E119 Type 2 diabetes mellitus without complications: Secondary | ICD-10-CM | POA: Diagnosis present

## 2018-07-31 DIAGNOSIS — M0579 Rheumatoid arthritis with rheumatoid factor of multiple sites without organ or systems involvement: Secondary | ICD-10-CM | POA: Diagnosis present

## 2018-07-31 DIAGNOSIS — Z79899 Other long term (current) drug therapy: Secondary | ICD-10-CM

## 2018-07-31 DIAGNOSIS — F329 Major depressive disorder, single episode, unspecified: Secondary | ICD-10-CM | POA: Diagnosis present

## 2018-07-31 DIAGNOSIS — Z880 Allergy status to penicillin: Secondary | ICD-10-CM

## 2018-07-31 DIAGNOSIS — K219 Gastro-esophageal reflux disease without esophagitis: Secondary | ICD-10-CM | POA: Diagnosis present

## 2018-07-31 DIAGNOSIS — I361 Nonrheumatic tricuspid (valve) insufficiency: Secondary | ICD-10-CM | POA: Diagnosis not present

## 2018-07-31 DIAGNOSIS — Z7982 Long term (current) use of aspirin: Secondary | ICD-10-CM

## 2018-07-31 DIAGNOSIS — F17210 Nicotine dependence, cigarettes, uncomplicated: Secondary | ICD-10-CM | POA: Diagnosis present

## 2018-07-31 DIAGNOSIS — I10 Essential (primary) hypertension: Secondary | ICD-10-CM | POA: Diagnosis present

## 2018-07-31 DIAGNOSIS — Z886 Allergy status to analgesic agent status: Secondary | ICD-10-CM

## 2018-07-31 DIAGNOSIS — I248 Other forms of acute ischemic heart disease: Secondary | ICD-10-CM | POA: Diagnosis present

## 2018-07-31 DIAGNOSIS — R531 Weakness: Secondary | ICD-10-CM | POA: Diagnosis present

## 2018-07-31 DIAGNOSIS — J441 Chronic obstructive pulmonary disease with (acute) exacerbation: Secondary | ICD-10-CM | POA: Diagnosis present

## 2018-07-31 DIAGNOSIS — Z801 Family history of malignant neoplasm of trachea, bronchus and lung: Secondary | ICD-10-CM

## 2018-07-31 DIAGNOSIS — Z833 Family history of diabetes mellitus: Secondary | ICD-10-CM

## 2018-07-31 DIAGNOSIS — L8990 Pressure ulcer of unspecified site, unspecified stage: Secondary | ICD-10-CM

## 2018-07-31 DIAGNOSIS — Z888 Allergy status to other drugs, medicaments and biological substances status: Secondary | ICD-10-CM

## 2018-07-31 DIAGNOSIS — E43 Unspecified severe protein-calorie malnutrition: Secondary | ICD-10-CM | POA: Diagnosis present

## 2018-07-31 DIAGNOSIS — Z885 Allergy status to narcotic agent status: Secondary | ICD-10-CM

## 2018-07-31 DIAGNOSIS — Z9181 History of falling: Secondary | ICD-10-CM

## 2018-07-31 DIAGNOSIS — Z7951 Long term (current) use of inhaled steroids: Secondary | ICD-10-CM

## 2018-07-31 DIAGNOSIS — R918 Other nonspecific abnormal finding of lung field: Secondary | ICD-10-CM | POA: Diagnosis present

## 2018-07-31 DIAGNOSIS — R0602 Shortness of breath: Secondary | ICD-10-CM | POA: Diagnosis present

## 2018-07-31 DIAGNOSIS — F419 Anxiety disorder, unspecified: Secondary | ICD-10-CM | POA: Diagnosis present

## 2018-07-31 DIAGNOSIS — I700 Atherosclerosis of aorta: Secondary | ICD-10-CM | POA: Diagnosis present

## 2018-07-31 DIAGNOSIS — Z923 Personal history of irradiation: Secondary | ICD-10-CM | POA: Diagnosis not present

## 2018-07-31 DIAGNOSIS — Z9981 Dependence on supplemental oxygen: Secondary | ICD-10-CM

## 2018-07-31 DIAGNOSIS — I370 Nonrheumatic pulmonary valve stenosis: Secondary | ICD-10-CM | POA: Diagnosis not present

## 2018-07-31 DIAGNOSIS — R778 Other specified abnormalities of plasma proteins: Secondary | ICD-10-CM

## 2018-07-31 DIAGNOSIS — R7989 Other specified abnormal findings of blood chemistry: Secondary | ICD-10-CM

## 2018-07-31 DIAGNOSIS — J9621 Acute and chronic respiratory failure with hypoxia: Secondary | ICD-10-CM | POA: Diagnosis present

## 2018-07-31 DIAGNOSIS — Z881 Allergy status to other antibiotic agents status: Secondary | ICD-10-CM

## 2018-07-31 LAB — CBC WITH DIFFERENTIAL/PLATELET
Abs Immature Granulocytes: 0.11 10*3/uL — ABNORMAL HIGH (ref 0.00–0.07)
Basophils Absolute: 0.1 10*3/uL (ref 0.0–0.1)
Basophils Relative: 0 %
Eosinophils Absolute: 0.1 10*3/uL (ref 0.0–0.5)
Eosinophils Relative: 1 %
HCT: 33.2 % — ABNORMAL LOW (ref 36.0–46.0)
Hemoglobin: 10.8 g/dL — ABNORMAL LOW (ref 12.0–15.0)
Immature Granulocytes: 1 %
Lymphocytes Relative: 5 %
Lymphs Abs: 0.7 10*3/uL (ref 0.7–4.0)
MCH: 37 pg — ABNORMAL HIGH (ref 26.0–34.0)
MCHC: 32.5 g/dL (ref 30.0–36.0)
MCV: 113.7 fL — ABNORMAL HIGH (ref 80.0–100.0)
Monocytes Absolute: 0.9 10*3/uL (ref 0.1–1.0)
Monocytes Relative: 6 %
Neutro Abs: 12.4 10*3/uL — ABNORMAL HIGH (ref 1.7–7.7)
Neutrophils Relative %: 87 %
Platelets: 248 10*3/uL (ref 150–400)
RBC: 2.92 MIL/uL — ABNORMAL LOW (ref 3.87–5.11)
RDW: 14.6 % (ref 11.5–15.5)
Smear Review: NORMAL
WBC: 14.2 10*3/uL — ABNORMAL HIGH (ref 4.0–10.5)
nRBC: 0 % (ref 0.0–0.2)

## 2018-07-31 LAB — BASIC METABOLIC PANEL
Anion gap: 7 (ref 5–15)
BUN: 12 mg/dL (ref 8–23)
CO2: 34 mmol/L — ABNORMAL HIGH (ref 22–32)
Calcium: 8.6 mg/dL — ABNORMAL LOW (ref 8.9–10.3)
Chloride: 91 mmol/L — ABNORMAL LOW (ref 98–111)
Creatinine, Ser: 0.47 mg/dL (ref 0.44–1.00)
GFR calc Af Amer: >60 mL/min (ref >60)
GFR calc non Af Amer: >60 mL/min (ref >60)
Glucose, Bld: 195 mg/dL — ABNORMAL HIGH (ref 70–99)
Potassium: 3.9 mmol/L (ref 3.5–5.1)
Sodium: 132 mmol/L — ABNORMAL LOW (ref 135–145)

## 2018-07-31 LAB — TROPONIN I
Troponin I: 0.06 ng/mL (ref <0.03)
Troponin I: <0.03 ng/mL (ref <0.03)
Troponin I: <0.03 ng/mL (ref <0.03)

## 2018-07-31 MED ORDER — PANTOPRAZOLE SODIUM 20 MG PO TBEC
20.0000 mg | DELAYED_RELEASE_TABLET | Freq: Every day | ORAL | Status: DC
  Administered 2018-07-31 – 2018-08-02 (×3): 20 mg via ORAL
  Filled 2018-07-31 (×4): qty 1

## 2018-07-31 MED ORDER — ACETAMINOPHEN 325 MG PO TABS: 650.0000 mg | ORAL_TABLET | Freq: Four times a day (QID) | ORAL | Status: DC | PRN

## 2018-07-31 MED ORDER — METHYLPREDNISOLONE SODIUM SUCC 125 MG IJ SOLR
125.0000 mg | Freq: Once | INTRAMUSCULAR | Status: AC
  Administered 2018-07-31: 125 mg via INTRAVENOUS
  Filled 2018-07-31: qty 2

## 2018-07-31 MED ORDER — IPRATROPIUM-ALBUTEROL 0.5-2.5 (3) MG/3ML IN SOLN
3.0000 mL | Freq: Once | RESPIRATORY_TRACT | Status: AC
  Administered 2018-07-31: 3 mL via RESPIRATORY_TRACT
  Filled 2018-07-31: qty 3

## 2018-07-31 MED ORDER — BUDESONIDE 0.5 MG/2ML IN SUSP
0.5000 mg | Freq: Two times a day (BID) | RESPIRATORY_TRACT | Status: DC
  Administered 2018-07-31 – 2018-08-03 (×6): 0.5 mg via RESPIRATORY_TRACT
  Filled 2018-07-31 (×6): qty 2

## 2018-07-31 MED ORDER — AMLODIPINE BESYLATE 5 MG PO TABS
2.5000 mg | ORAL_TABLET | Freq: Every day | ORAL | Status: DC
  Filled 2018-07-31: qty 1

## 2018-07-31 MED ORDER — METHYLPREDNISOLONE SODIUM SUCC 40 MG IJ SOLR
40.0000 mg | Freq: Three times a day (TID) | INTRAMUSCULAR | Status: DC
  Administered 2018-07-31 – 2018-08-03 (×8): 40 mg via INTRAVENOUS
  Filled 2018-07-31 (×9): qty 1

## 2018-07-31 MED ORDER — ONDANSETRON HCL 4 MG PO TABS: 4.0000 mg | ORAL_TABLET | Freq: Four times a day (QID) | ORAL | Status: DC | PRN

## 2018-07-31 MED ORDER — IPRATROPIUM-ALBUTEROL 0.5-2.5 (3) MG/3ML IN SOLN
3.0000 mL | Freq: Four times a day (QID) | RESPIRATORY_TRACT | Status: DC
  Administered 2018-07-31 – 2018-08-01 (×2): 3 mL via RESPIRATORY_TRACT
  Filled 2018-07-31 (×3): qty 3

## 2018-07-31 MED ORDER — MIRTAZAPINE 15 MG PO TABS
30.0000 mg | ORAL_TABLET | Freq: Every day | ORAL | Status: DC
  Administered 2018-07-31 – 2018-08-02 (×3): 30 mg via ORAL
  Filled 2018-07-31 (×3): qty 2

## 2018-07-31 MED ORDER — ASPIRIN EC 81 MG PO TBEC
81.0000 mg | DELAYED_RELEASE_TABLET | Freq: Every day | ORAL | Status: DC
  Administered 2018-08-01 – 2018-08-03 (×3): 81 mg via ORAL
  Filled 2018-07-31 (×3): qty 1

## 2018-07-31 MED ORDER — ENOXAPARIN SODIUM 30 MG/0.3ML ~~LOC~~ SOLN
30.0000 mg | SUBCUTANEOUS | Status: DC
  Administered 2018-07-31 – 2018-08-01 (×2): 30 mg via SUBCUTANEOUS
  Filled 2018-07-31 (×3): qty 0.3

## 2018-07-31 MED ORDER — ENOXAPARIN SODIUM 40 MG/0.4ML ~~LOC~~ SOLN: 40.0000 mg | SUBCUTANEOUS | Status: DC

## 2018-07-31 MED ORDER — ATENOLOL 50 MG PO TABS
50.0000 mg | ORAL_TABLET | Freq: Every day | ORAL | Status: DC
  Filled 2018-07-31 (×2): qty 1

## 2018-07-31 MED ORDER — VITAMIN D3 25 MCG (1000 UNIT) PO TABS
1000.0000 [IU] | ORAL_TABLET | Freq: Every day | ORAL | Status: DC
  Administered 2018-08-01 – 2018-08-02 (×2): 1000 [IU] via ORAL
  Filled 2018-07-31 (×7): qty 1

## 2018-07-31 MED ORDER — PANTOPRAZOLE SODIUM 40 MG PO TBEC: 40.0000 mg | DELAYED_RELEASE_TABLET | Freq: Every day | ORAL | Status: DC

## 2018-07-31 MED ORDER — IPRATROPIUM-ALBUTEROL 0.5-2.5 (3) MG/3ML IN SOLN
3.0000 mL | Freq: Once | RESPIRATORY_TRACT | Status: AC
  Administered 2018-07-31: 3 mL via RESPIRATORY_TRACT

## 2018-07-31 MED ORDER — CLONAZEPAM 0.5 MG PO TABS
0.5000 mg | ORAL_TABLET | Freq: Two times a day (BID) | ORAL | Status: DC | PRN
  Administered 2018-07-31 – 2018-08-03 (×6): 0.5 mg via ORAL
  Filled 2018-07-31 (×6): qty 1

## 2018-07-31 MED ORDER — ACETAMINOPHEN 650 MG RE SUPP: 650.0000 mg | Freq: Four times a day (QID) | RECTAL | Status: DC | PRN

## 2018-07-31 MED ORDER — ONDANSETRON HCL 4 MG/2ML IJ SOLN: 4.0000 mg | Freq: Four times a day (QID) | INTRAMUSCULAR | Status: DC | PRN

## 2018-07-31 MED ORDER — QUETIAPINE FUMARATE 25 MG PO TABS
25.0000 mg | ORAL_TABLET | Freq: Every day | ORAL | Status: DC
  Administered 2018-07-31 – 2018-08-02 (×3): 25 mg via ORAL
  Filled 2018-07-31 (×3): qty 1

## 2018-07-31 MED ORDER — DOXYCYCLINE HYCLATE 100 MG PO TABS
100.0000 mg | ORAL_TABLET | Freq: Two times a day (BID) | ORAL | Status: DC
  Administered 2018-07-31 – 2018-08-03 (×6): 100 mg via ORAL
  Filled 2018-07-31 (×7): qty 1

## 2018-07-31 NOTE — ED Notes (Signed)
ED TO INPATIENT HANDOFF REPORT  Name/Age/Gender Madeline Schmitt 72 y.o. female  Code Status   Home/SNF/Other home  Chief Complaint Difficulty Breathing   Level of Care/Admitting Diagnosis ED Disposition    ED Disposition Condition [place]: [place] [[id number]]  Level of Care: Med-Surg [16]  Diagnosis: COPD exacerbation [facility]) [[id number]]  Admitting Physician: Henreitta Leber [[id number]]  Attending Physician: Henreitta Leber [[id number]]  Estimated length of stay: past midnight tomorrow  Certification:: I certify this patient will need inpatient services for at least 2 midnights  PT Class (Do Not Modify): Inpatient [101]  PT Acc Code (Do Not Modify): Private [1]       Medical History Past Medical History:  Diagnosis Date  . Aortic atherosclerosis ([place])   . Arrhythmia   . COPD (chronic obstructive pulmonary disease) ([place])   . Diabetes mellitus type 2, uncomplicated ([place])   . Essential hypertension   . History of pelvic fracture   . Hyperlipidemia, unspecified   . IBS (irritable bowel syndrome)   . Lung cancer ([place])   . Lung mass   . Osteoporosis   . Rheumatoid arthritis involving multiple sites with positive rheumatoid factor ([place])   . Secondary polycythemia     Allergies Allergies  Allergen Reactions  . Augmentin [Amoxicillin-Pot Clavulanate] Shortness Of Breath  . Penicillins Shortness Of Breath  . Ace Inhibitors Other (See Comments)    Unknown   . Actonel [Risedronate Sodium] Other (See Comments)    Chest tightness  . Celexa [Citalopram Hydrobromide] Other (See Comments)    Chest tightness  . Ciprofloxacin Itching    Facial itching  . Dicyclomine Hcl Other (See Comments)    Flatulence  . Hydrochlorothiazide W-Triamterene     Other reaction(s): Other (See Comments) cramps  . Maxzide [Triamterene-Hctz] Other (See Comments)    unknown  . Nicoderm [Nicotine] Other (See Comments)    Unknown   . Nsaids Other (See  Comments)    dyspepsia Unknown   . Oxycodone Other (See Comments)    Alt Mental Status  . Zyban [Bupropion] Other (See Comments)    Nervous    IV Location/Drains/Wounds Patient Lines/Drains/Airways Status   Active Line/Drains/Airways    Name:   Placement date:   Placement time:   Site:   Days:   Peripheral IV 07/31/18 Right   07/31/18    1325    -   less than 1          Labs/Imaging Results for orders placed or performed during the hospital encounter of 07/31/18 (from the past 48 hour(s))  Troponin I - ONCE - STAT     Status: Abnormal   Collection Time: 07/31/18  1:43 PM  Result Value Ref Range   Troponin I 0.06 (HH) <0.03 ng/mL    Comment: CRITICAL RESULT CALLED TO, READ BACK BY AND VERIFIED WITH JEANETTE PEREZ 07/31/18 1431 KLW Performed at [facility], [address] [place], [place] [address]   CBC with Differential     Status: Abnormal   Collection Time: 07/31/18  1:43 PM  Result Value Ref Range   WBC 14.2 (H) 4.0 - 10.5 K/uL   RBC 2.92 (L) 3.87 - 5.11 MIL/uL   Hemoglobin 10.8 (L) 12.0 - 15.0 g/dL   HCT 33.2 (L) 36.0 - 46.0 %   MCV 113.7 (H) 80.0 - 100.0 fL   MCH 37.0 (H) 26.0 - 34.0 pg   MCHC 32.5 30.0 - 36.0 g/dL  RDW 14.6 11.5 - 15.5 %   Platelets 248 150 - 400 K/uL   nRBC 0.0 0.0 - 0.2 %   Neutrophils Relative % 87 %   Neutro Abs 12.4 (H) 1.7 - 7.7 K/uL   Lymphocytes Relative 5 %   Lymphs Abs 0.7 0.7 - 4.0 K/uL   Monocytes Relative 6 %   Monocytes Absolute 0.9 0.1 - 1.0 K/uL   Eosinophils Relative 1 %   Eosinophils Absolute 0.1 0.0 - 0.5 K/uL   Basophils Relative 0 %   Basophils Absolute 0.1 0.0 - 0.1 K/uL   WBC Morphology MORPHOLOGY UNREMARKABLE    Smear Review Normal platelet morphology    Immature Granulocytes 1 %   Abs Immature Granulocytes 0.11 (H) 0.00 - 0.07 K/uL   Polychromasia PRESENT     Comment: Performed at [facility], [address] [place], [place] [address]  Basic metabolic panel     Status: Abnormal    Collection Time: 07/31/18  1:43 PM  Result Value Ref Range   Sodium 132 (L) 135 - 145 mmol/L   Potassium 3.9 3.5 - 5.1 mmol/L    Comment: HEMOLYSIS AT THIS LEVEL MAY AFFECT RESULT   Chloride 91 (L) 98 - 111 mmol/L   CO2 34 (H) 22 - 32 mmol/L   Glucose, Bld 195 (H) 70 - 99 mg/dL   BUN 12 8 - 23 mg/dL   Creatinine, Ser 0.47 0.44 - 1.00 mg/dL   Calcium 8.6 (L) 8.9 - 10.3 mg/dL   GFR calc non Af Amer >60 >60 mL/min   GFR calc Af Amer >60 >60 mL/min   Anion gap 7 5 - 15    Comment: Performed at [facility], [address], [place], [place] [address]   Dg Chest 2 View  Result Date: 07/31/2018 CLINICAL DATA:  72 y/o F; 3 weeks productive cough and shortness of breath for 7 day days. EXAM: CHEST - 2 VIEW COMPARISON:  07/27/2018 chest radiograph. FINDINGS: Stable cardiac silhouette given projection and technique, partially obscured by left lung airspace opacities. Calcific aortic atherosclerosis. Elevation of left hemidiaphragm, left upper lobe paramediastinal mass, and left effusion are stable. Stable emphysema and right lung hyperinflation. No new focal consolidation. Acute fracture of the left humeral neck again noted. Stable L1 severe compression deformity. IMPRESSION: Stable elevated left hemidiaphragm, left upper lobe paramediastinal mass, and left effusion. No new focal consolidation. Electronically Signed   By: Kristine Garbe M.D.   On: 07/31/2018 14:19    Pending Labs [employer] (From admission, onward)    Start     Ordered   Signed and Occupational hygienist morning,   R     Signed and Held   Signed and Held  CBC  Tomorrow morning,   R     Signed and Held   Signed and Held  CBC  (enoxaparin (LOVENOX)    CrCl >/= 30 ml/min)  Once,   R    Comments:  Baseline for enoxaparin therapy IF NOT ALREADY DRAWN.  Notify MD if PLT < 100 K.    Signed and Held   Signed and Held  Creatinine, serum  (enoxaparin (LOVENOX)    CrCl >/= 30 ml/min)  Once,   R     Comments:  Baseline for enoxaparin therapy IF NOT ALREADY DRAWN.    Signed and Held   Signed and Held  Creatinine, serum  (enoxaparin (LOVENOX)    CrCl >/= 30 ml/min)  Weekly,  R    Comments:  while on enoxaparin therapy    Signed and Held   Signed and Held  Troponin I - Now Then Q4H  Now then every 4 hours,   R     Signed and Held          Vitals/Pain Today's Vitals   07/31/18 1700 07/31/18 1715 07/31/18 1730 07/31/18 1745  BP: (!) 103/51  (!) 103/55   Pulse: (!) 102 (!) 111 (!) 106 (!) 118  Resp: 17 18 18  (!) 27  Temp:      TempSrc:      SpO2: 93% 93% 93% 91%  Weight:      Height:      PainSc:        Isolation Precautions No active isolations  Medications Medications  ipratropium-albuterol (DUONEB) 0.5-2.5 (3) MG/3ML nebulizer solution 3 mL (3 mLs Nebulization Given 07/31/18 1444)  ipratropium-albuterol (DUONEB) 0.5-2.5 (3) MG/3ML nebulizer solution 3 mL (3 mLs Nebulization Given 07/31/18 1444)  methylPREDNISolone sodium succinate (SOLU-MEDROL) 125 mg/2 mL injection 125 mg (125 mg Intravenous Given 07/31/18 1546)    Mobility Stretcher }

## 2018-07-31 NOTE — Progress Notes (Signed)
Please note patient has a pending outpatient Palliative referral from ED visit on 12/12. CMRN Doran Clay made aware.  Flo Shanks RN, BSN, [facility] [place], hospital liaison [phone]

## 2018-07-31 NOTE — ED Notes (Addendum)
IV meds given, Case manager at bedside to discuss  Home care options.

## 2018-07-31 NOTE — H&P (Signed)
[place] [facility] [place] NAME: Madeline Schmitt    MR#:  [id number]  DATE OF BIRTH:  22-Nov-1945  DATE OF ADMISSION:  07/31/2018  PRIMARY CARE PHYSICIAN: Adin Hector, MD   REQUESTING/REFERRING PHYSICIAN: Dr. Nance Pear  CHIEF COMPLAINT:   Chief Complaint  Patient presents with  . Shortness of Breath    HISTORY OF PRESENT ILLNESS:  Madeline Schmitt  is a 72 y.o. female with a known history of COPD with ongoing tobacco abuse, chronic respiratory failure, hypertension, hyperlipidemia, IBS, osteoporosis, rheumatoid arthritis who presents to the hospital due to significant weakness and shortness of breath.  Patient apparently had a mechanical fall about 10 days ago and had a left humeral fracture.  She was seen in the ER and then discharged home and was receiving home health services but over the past 10 days she has become progressively weak and has not been able to take care of herself.  She is not even able to get out of bed.  She is been receiving home health nursing services with noticed that her O2 sats have been lower than normal and therefore she was sent to the ER for further evaluation.  In the emergency room patient was noted to be somewhat hypoxic and given some steroids and duo nebs and also noted to have a mildly elevated troponin.  Hospitalist services were contacted for admission.  Patient admits to a cough which is nonproductive, but she denies any fevers chills, nausea, vomiting, abdominal pain or any other associated symptoms presently.  PAST MEDICAL HISTORY:   Past Medical History:  Diagnosis Date  . Aortic atherosclerosis ([place])   . Arrhythmia   . COPD (chronic obstructive pulmonary disease) ([place])   . Diabetes mellitus type 2, uncomplicated ([place])   . Essential hypertension   . History of pelvic fracture   . Hyperlipidemia, unspecified   . IBS (irritable bowel syndrome)   . Lung cancer ([place])   . Lung mass   . Osteoporosis    . Rheumatoid arthritis involving multiple sites with positive rheumatoid factor ([place])   . Secondary polycythemia     PAST SURGICAL HISTORY:   Past Surgical History:  Procedure Laterality Date  . BREAST BIOPSY Bilateral 1970   negative  . COLONOSCOPY     03/30/2002, 12/25/2004, 03/17/2007, 05/08/2010  . Resection of chronic olecranon bursitis    . S/P oral surgery      SOCIAL HISTORY:   Social History   Tobacco Use  . Smoking status: Current Some Day Smoker    Packs/day: 0.50    Years: 55.00    Pack years: 27.50  . Smokeless tobacco: Never Used  Substance Use Topics  . Alcohol use: Yes    Alcohol/week: 14.0 standard drinks    Types: 14 Glasses of wine per week    Comment: 1-3 glasses of wine a day    FAMILY HISTORY:   Family History  Problem Relation Age of Onset  . Lung cancer Brother   . Bone cancer Brother   . Diabetes Mother   . Breast cancer Neg Hx     DRUG ALLERGIES:   Allergies  Allergen Reactions  . Augmentin [Amoxicillin-Pot Clavulanate] Shortness Of Breath  . Penicillins Shortness Of Breath  . Ace Inhibitors Other (See Comments)    Unknown   . Actonel [Risedronate Sodium] Other (See Comments)    Chest tightness  . Celexa [Citalopram Hydrobromide] Other (See Comments)    Chest tightness  .  Ciprofloxacin Itching    Facial itching  . Dicyclomine Hcl Other (See Comments)    Flatulence  . Hydrochlorothiazide W-Triamterene     Other reaction(s): Other (See Comments) cramps  . Maxzide [Triamterene-Hctz] Other (See Comments)    unknown  . Nicoderm [Nicotine] Other (See Comments)    Unknown   . Nsaids Other (See Comments)    dyspepsia Unknown   . Oxycodone Other (See Comments)    Alt Mental Status  . Zyban [Bupropion] Other (See Comments)    Nervous    REVIEW OF SYSTEMS:   Review of Systems  Constitutional: Negative for fever and weight loss.  HENT: Negative for congestion, nosebleeds and tinnitus.   Eyes: Negative for blurred  vision, double vision and redness.  Respiratory: Positive for cough and shortness of breath. Negative for hemoptysis.   Cardiovascular: Negative for chest pain, orthopnea, leg swelling and PND.  Gastrointestinal: Negative for abdominal pain, diarrhea, melena, nausea and vomiting.  Genitourinary: Negative for dysuria, hematuria and urgency.  Musculoskeletal: Positive for falls. Negative for joint pain.  Neurological: Positive for weakness (Generalized). Negative for dizziness, tingling, sensory change, focal weakness, seizures and headaches.  Endo/Heme/Allergies: Negative for polydipsia. Does not bruise/bleed easily.  Psychiatric/Behavioral: Negative for depression and memory loss. The patient is not nervous/anxious.     MEDICATIONS AT HOME:   Prior to Admission medications   Medication Sig Start Date End Date Taking? Authorizing Provider  ADVAIR DISKUS 500-50 MCG/DOSE AEPB Inhale 1 puff into the lungs 2 (two) times daily.  10/15/16  Yes [provider]  albuterol (PROAIR HFA) 108 (90 Base) MCG/ACT inhaler Inhale 2 puffs into the lungs every 6 (six) hours as needed for wheezing or shortness of breath.   Yes [provider]  amLODipine (NORVASC) 2.5 MG tablet Take 2.5 mg by mouth daily.   Yes [provider]  aspirin 81 MG tablet Take 81 mg by mouth daily.   Yes [provider]  atenolol (TENORMIN) 50 MG tablet Take 50 mg by mouth daily.  06/29/16  Yes [provider]  clonazePAM (KLONOPIN) 0.5 MG tablet Take 0.5 mg by mouth 2 (two) times daily as needed for anxiety.   Yes [provider]  doxycycline (VIBRA-TABS) 100 MG tablet Take 1 tablet (100 mg total) by mouth 2 (two) times daily for 7 days. 07/27/18 08/03/18 Yes Merlyn Lot, MD  mirtazapine (REMERON) 30 MG tablet Take 30 mg by mouth at bedtime.   Yes [provider]  pantoprazole (PROTONIX) 20 MG tablet Take 20 mg by mouth at bedtime.   Yes [provider]    azelastine (ASTELIN) 0.1 % nasal spray Place into the nose. 12/28/17 12/28/18  [provider]  Cholecalciferol 1000 units tablet Take 1,000 Units by mouth daily.    [provider]  magic mouthwash SOLN Take 5 mLs by mouth 4 (four) times daily as needed for mouth pain. Patient not taking: Reported on 07/27/2018 12/19/17   Noreene Filbert, MD  omeprazole (PRILOSEC) 20 MG capsule Take 20 mg by mouth daily as needed.     [provider]  ondansetron (ZOFRAN) 8 MG tablet Take 1 tablet (8 mg total) by mouth every 8 (eight) hours as needed for nausea or vomiting. Patient not taking: Reported on 07/27/2018 03/10/17   Noreene Filbert, MD  QUEtiapine (SEROQUEL) 25 MG tablet Take 25 mg by mouth at bedtime. 01/22/16   [provider]  sucralfate (CARAFATE) 1 g tablet Take 1 tablet (1 g total) by mouth  3 (three) times daily. Dissolve in 3-4 tbsp warm water, swish and swallow. Patient not taking: Reported on 07/27/2018 01/11/18   Noreene Filbert, MD  traMADol (ULTRAM) 50 MG tablet Take 1 tablet (50 mg total) by mouth every 6 (six) hours as needed. Patient not taking: Reported on 07/27/2018 07/21/18   Carrie Mew, MD      VITAL SIGNS:  Blood pressure 116/67, pulse (!) 112, temperature 97.6 F (36.4 C), temperature source Oral, resp. rate (!) 24, height 5\' 3"  (1.6 m), weight 42 kg, SpO2 96 %.  PHYSICAL EXAMINATION:  Physical Exam  GENERAL:  72 y.o.-year-old patient lying in the bed in no acute distress.  EYES: Pupils equal, round, reactive to light and accommodation. No scleral icterus. Extraocular muscles intact.  HEENT: Head atraumatic, normocephalic. Oropharynx and nasopharynx clear. No oropharyngeal erythema, moist oral mucosa  NECK:  Supple, no jugular venous distention. No thyroid enlargement, no tenderness.  LUNGS: Diffuse upper airway rhonchi bilaterally, poor inspiratory expiratory effort, negative use of accessory muscles, no dullness to percussion.    CARDIOVASCULAR: S1, S2 RRR. No murmurs, rubs, gallops, clicks.  ABDOMEN: Soft, nontender, nondistended. Bowel sounds present. No organomegaly or mass.  EXTREMITIES: No pedal edema, cyanosis, or clubbing. + 2 pedal & radial pulses b/l.  Left shoulder in a sling. NEUROLOGIC: Cranial nerves II through XII are intact. No focal Motor or sensory deficits appreciated b/l. Globally weak.  PSYCHIATRIC: The patient is alert and oriented x 3.  SKIN: No obvious rash, lesion, or ulcer.   LABORATORY PANEL:   CBC Recent Labs  Lab 07/31/18 1343  WBC 14.2*  HGB 10.8*  HCT 33.2*  PLT 248   ------------------------------------------------------------------------------------------------------------------  Chemistries  Recent Labs  Lab 07/27/18 1156 07/31/18 1343  NA  --  132*  K  --  3.9  CL  --  91*  CO2  --  34*  GLUCOSE  --  195*  BUN  --  12  CREATININE  --  0.47  CALCIUM  --  8.6*  AST 18  --   ALT 12  --   ALKPHOS 107  --   BILITOT 0.9  --    ------------------------------------------------------------------------------------------------------------------  Cardiac Enzymes Recent Labs  Lab 07/31/18 1343  TROPONINI 0.06*   ------------------------------------------------------------------------------------------------------------------  RADIOLOGY:  Dg Chest 2 View  Result Date: 07/31/2018 CLINICAL DATA:  72 y/o F; 3 weeks productive cough and shortness of breath for 7 day days. EXAM: CHEST - 2 VIEW COMPARISON:  07/27/2018 chest radiograph. FINDINGS: Stable cardiac silhouette given projection and technique, partially obscured by left lung airspace opacities. Calcific aortic atherosclerosis. Elevation of left hemidiaphragm, left upper lobe paramediastinal mass, and left effusion are stable. Stable emphysema and right lung hyperinflation. No new focal consolidation. Acute fracture of the left humeral neck again noted. Stable L1 severe compression deformity. IMPRESSION: Stable  elevated left hemidiaphragm, left upper lobe paramediastinal mass, and left effusion. No new focal consolidation. Electronically Signed   By: Kristine Garbe M.D.   On: 07/31/2018 14:19     IMPRESSION AND PLAN:   72 year old female with past medical history of COPD with ongoing tobacco abuse, chronic respiratory failure, rheumatoid arthritis, osteoporosis, hypertension, hyperlipidemia, previous history of lung cancer who presents to the hospital due to generalized weakness and shortness of breath.  1.  Acute on chronic respiratory failure with hypoxia- secondary to COPD exacerbation. - Continue O2 supplementation, will treat underlying COPD with IV steroids scheduled duo nebs, Pulmicort nebs. - Patient was empirically on oral doxycycline for  bronchitis and will continue that.    2.  COPD exacerbation-source of patient's worsening shortness of breath. -We will treat the patient with IV steroids, scheduled duo nebs, Pulmicort nebs.  Continue empiric doxycycline.  3.  Generalized weakness/recent fall-secondary to deconditioning, underlying COPD. - Patient had a recent fall with left-sided humeral fracture and has developed worsening weakness and is not able to get around well. - Continue treatment of underlying COPD as mentioned above. -We will get physical therapy consult to assess mobility. -Social work consult for possible placement.  4.  Elevated troponin-likely in the setting of supply demand ischemia from hypoxemia. - Will observe on telemetry, cycle cardiac markers.  Check echocardiogram.  5.  Essential hypertension-continue atenolol, Norvasc.    6.  GERD-continue Protonix  7.  Anxiety-continue Klonopin.    All the records are reviewed and case discussed with ED provider. Management plans discussed with the patient, family and they are in agreement.  CODE STATUS: DNR  TOTAL TIME TAKING CARE OF THIS PATIENT: 45 minutes.    Henreitta Leber M.D on 07/31/2018 at 3:43  PM  Between 7am to 6pm - Pager - [phone]  After 6pm go to [email or web address] - password [email or web address] [facility]  [place] [facility]  Office  [phone]  CC: Primary care physician; Adin Hector, MD

## 2018-07-31 NOTE — ED Notes (Signed)
Chest xray completed, ned treatment in progress.

## 2018-07-31 NOTE — Progress Notes (Signed)
PHARMACIST - PHYSICIAN COMMUNICATION  CONCERNING:  Enoxaparin (Lovenox) for DVT Prophylaxis    RECOMMENDATION: Patient was prescribed enoxaprin 40mg  q24 hours for VTE prophylaxis.   Filed Weights   07/31/18 1331  Weight: 92 lb 9.5 oz (42 kg)    Body mass index is 16.4 kg/m.  Estimated Creatinine Clearance: 40.9 mL/min (by C-G formula based on SCr of 0.47 mg/dL).   Patient is candidate for enoxaparin 30mg  every 24 hours based on CrCl <23ml/min or Weight less then 45kg for female and 50kg for female  DESCRIPTION: Pharmacy has adjusted enoxaparin dose per [facility] policy.  Patient is now receiving enoxaparin 30mg  every 24 hours.  Forrest Moron, PharmD Clinical Pharmacist  07/31/2018 6:27 PM

## 2018-07-31 NOTE — ED Triage Notes (Signed)
Called to patients home by patient with c/o of sob. As per ems patient sating 77% on 2lnc at home. Arrives to ed with 6lnc sating 89%. Patient aox4. With auditory wheezing noted, scattered rales throughout lung field.

## 2018-07-31 NOTE — Care Management Note (Signed)
Case Management Note  Patient Details  Name: Madeline Schmitt MRN: [id number] Date of Birth: 08/07/1946  Subjective/Objective:  Patient is going to be admitted for elevated troponin.  Patient is from home and lives alone.  Daughter lives close by.  Patient is having a difficult time caring for herself at home.  Last visit to the ED patient was set up with [place] through [place] care, RN, PT, SW, and aide.  Floydene Flock with [facility] notified of patient admission. Marcene Brawn with [facility] [place] was also contacted to assist with setting up OP Palliative.  Flo Shanks with [facility] and palliative notified of patient admission.  Patient has chronic O2 at 2L.   RNCM spoke with patient and patient's daughter [name] and patient's son.  Daughter and son both reports that she cannot stay by herself any more, she is requiring 24 hr care at home.  Daughter asked about what they can do if the patient doesn't meet the 3 night inpatient stay.  RNCM gave the family a list of personal care service agencies and will refer to social work for information on facilities in the area.  RNCM informed family and patient that they could pay out of pocket for a facility if the patient does not meet the 3 night stay criteria for Medicare.  Family and patient verbalize understanding.   RNCM will cont to follow. Doran Clay RN BSN [phone]              Action/Plan:   Expected Discharge Date:                  Expected Discharge Plan:     In-House Referral:     Discharge planning Services     Post Acute Care Choice:    Choice offered to:     DME Arranged:    DME Agency:     HH Arranged:    HH Agency:     Status of Service:     If discussed at [place] of Stay Meetings, dates discussed:    Additional Comments:  Shelbie Hutching, RN 07/31/2018, 3:34 PM

## 2018-07-31 NOTE — ED Provider Notes (Signed)
[facility] Emergency Department Provider Note   ____________________________________________   I have reviewed the triage vital signs and the nursing notes.   HISTORY  Chief Complaint Three day admission for rehab  History limited by: Not Limited   HPI Madeline Schmitt is a 72 y.o. female who presents to the emergency department today because she states she was told by her primary care doctor that she would need a three day admission to be eligible for rehab facility. The patient thinks she would benefit from rehab because of concern for continued weakness and difficulty taking care of herself because of a left humerus fracture that occurred 10 days ago. In addition however the son states that the patient has also been having problems with her breathing and hypoxia. Patient is typically on 2L of oxygen however home health had noted that her oxygen levels were in the 70s the past two days. Patient denies any chest pain. She denies any fevers.   Per medical record review patient has a history of lung mass, IBS  Past Medical History:  Diagnosis Date  . Aortic atherosclerosis ([place])   . Arrhythmia   . COPD (chronic obstructive pulmonary disease) ([place])   . Diabetes mellitus type 2, uncomplicated ([place])   . Essential hypertension   . History of pelvic fracture   . Hyperlipidemia, unspecified   . IBS (irritable bowel syndrome)   . Lung cancer ([place])   . Lung mass   . Osteoporosis   . Rheumatoid arthritis involving multiple sites with positive rheumatoid factor ([place])   . Secondary polycythemia     Patient Active Problem List   Diagnosis Date Noted  . Arrhythmia 01/07/2017  . COPD (chronic obstructive pulmonary disease) ([place]) 01/07/2017  . Diabetes mellitus type 2, uncomplicated ([place]) 18/29/9371  . Essential hypertension 01/07/2017  . Hyperlipidemia, unspecified 01/07/2017  . IBS (irritable bowel syndrome) 01/07/2017  . Macrocytosis without anemia 08/25/2016   . Thrombocytopenia ([place]) 08/25/2016  . Tobacco abuse 08/25/2016  . UTI (urinary tract infection) 08/25/2016  . Malignant neoplasm of upper lobe of left lung ([place]) 07/07/2016  . Mass of upper lobe of left lung 06/22/2016  . Grief reaction 12/30/2015  . Secondary polycythemia 12/23/2014  . Osteoporosis 04/09/2014  . Rheumatoid arthritis involving multiple sites with positive rheumatoid factor ([place]) 04/09/2014    Past Surgical History:  Procedure Laterality Date  . BREAST BIOPSY Bilateral 1970   negative  . COLONOSCOPY     03/30/2002, 12/25/2004, 03/17/2007, 05/08/2010  . Resection of chronic olecranon bursitis    . S/P oral surgery      Prior to Admission medications   Medication Sig Start Date End Date Taking? Authorizing Provider  ADVAIR DISKUS 500-50 MCG/DOSE AEPB  10/15/16   [provider]  albuterol (PROAIR HFA) 108 (90 Base) MCG/ACT inhaler Inhale 2 puffs into the lungs every 6 (six) hours as needed for wheezing or shortness of breath.    [provider]  amLODipine (NORVASC) 2.5 MG tablet Take 2.5 mg by mouth daily.    [provider]  aspirin 81 MG tablet Take 81 mg by mouth daily.    [provider]  atenolol (TENORMIN) 50 MG tablet Take 100 mg by mouth daily.  06/29/16   [provider]  azelastine (ASTELIN) 0.1 % nasal spray Place into the nose. 12/28/17 12/28/18  [provider]  Cholecalciferol 1000 units tablet Take 1,000 Units by mouth daily.    [provider]  clonazePAM (KLONOPIN) 0.5 MG  tablet Take 0.5 mg by mouth 2 (two) times daily as needed for anxiety.    [provider]  doxycycline (VIBRA-TABS) 100 MG tablet Take 1 tablet (100 mg total) by mouth 2 (two) times daily for 7 days. 07/27/18 08/03/18  Merlyn Lot, MD  magic mouthwash SOLN Take 5 mLs by mouth 4 (four) times daily as needed for mouth pain. Patient not taking: Reported on 07/27/2018 12/19/17   Noreene Filbert, MD  mirtazapine  (REMERON) 30 MG tablet Take 30 mg by mouth at bedtime.    [provider]  omeprazole (PRILOSEC) 20 MG capsule Take 20 mg by mouth daily as needed.     [provider]  ondansetron (ZOFRAN) 8 MG tablet Take 1 tablet (8 mg total) by mouth every 8 (eight) hours as needed for nausea or vomiting. Patient not taking: Reported on 07/27/2018 03/10/17   Noreene Filbert, MD  pantoprazole (PROTONIX) 20 MG tablet Take 20 mg by mouth at bedtime.    [provider]  QUEtiapine (SEROQUEL) 25 MG tablet Take 25 mg by mouth at bedtime. 01/22/16   [provider]  sucralfate (CARAFATE) 1 g tablet Take 1 tablet (1 g total) by mouth 3 (three) times daily. Dissolve in 3-4 tbsp warm water, swish and swallow. Patient not taking: Reported on 07/27/2018 01/11/18   Noreene Filbert, MD  traMADol (ULTRAM) 50 MG tablet Take 1 tablet (50 mg total) by mouth every 6 (six) hours as needed. Patient not taking: Reported on 07/27/2018 07/21/18   Carrie Mew, MD    Allergies Augmentin [amoxicillin-pot clavulanate]; Penicillins; Ace inhibitors; Actonel [risedronate sodium]; Celexa [citalopram hydrobromide]; Ciprofloxacin; Dicyclomine hcl; Hydrochlorothiazide w-triamterene; Maxzide [triamterene-hctz]; Nicoderm [nicotine]; Nsaids; Oxycodone; and Zyban [bupropion]  Family History  Problem Relation Age of Onset  . Lung cancer Brother   . Bone cancer Brother   . Breast cancer Neg Hx     Social History Social History   Tobacco Use  . Smoking status: Current Some Day Smoker    Packs/day: 0.50    Years: 55.00    Pack years: 27.50  . Smokeless tobacco: Never Used  Substance Use Topics  . Alcohol use: Yes    Alcohol/week: 14.0 standard drinks    Types: 14 Glasses of wine per week    Comment: 1-3 glasses of wine a day  . Drug use: No    Review of Systems Constitutional: No fever/chills. Positive for generalized weakness.  Eyes: No visual changes. ENT: No sore throat. Cardiovascular:  Denies chest pain. Respiratory: Positive for shortness of breath and cough. Gastrointestinal: No abdominal pain.  No nausea, no vomiting.  No diarrhea.   Genitourinary: Negative for dysuria. Musculoskeletal: Negative for back pain. Skin: Negative for rash. Neurological: Negative for headaches, focal weakness or numbness.  ____________________________________________   PHYSICAL EXAM:  VITAL SIGNS: ED Triage Vitals  Enc Vitals Group     BP 07/31/18 1330 116/67     Pulse Rate 07/31/18 1330 (!) 112     Resp 07/31/18 1330 (!) 24     Temp 07/31/18 1330 97.6 F (36.4 C)     Temp Source 07/31/18 1330 Oral     SpO2 07/31/18 1330 96 %     Weight 07/31/18 1331 92 lb 9.5 oz (42 kg)     Height 07/31/18 1331 5\' 3"  (1.6 m)     Head Circumference --      Peak Flow --      Pain Score 07/31/18 1331 0   Constitutional: Alert and oriented.  Eyes: Conjunctivae are normal.  ENT      Head: Normocephalic and atraumatic.      Nose: No congestion/rhinnorhea.      Mouth/Throat: Mucous membranes are moist.      Neck: No stridor. Hematological/Lymphatic/Immunilogical: No cervical lymphadenopathy. Cardiovascular: Normal rate, regular rhythm.  No murmurs, rubs, or gallops.  Respiratory: Increased respiratory effort. Diffuse expiratory wheezing.  Gastrointestinal: Soft and non tender. No rebound. No guarding.  Genitourinary: Deferred Musculoskeletal: Normal range of motion in all extremities. No lower extremity edema. Neurologic:  Normal speech and language. No gross focal neurologic deficits are appreciated.  Skin:  Skin is warm, dry and intact. No rash noted. Psychiatric: Mood and affect are normal. Speech and behavior are normal. Patient exhibits appropriate insight and judgment.  ____________________________________________    LABS (pertinent positives/negatives)  CBC wbc 14.2, hgb 10.8, plt 248 BMP na 132, k 3.9, glu 195, cr 0.47 Trop  0.06  ____________________________________________   EKG  I, Nance Pear, attending physician, personally viewed and interpreted this EKG  EKG Time: 1324 Rate: 117 Rhythm: sinus tachycardia Axis: left axis deviation Intervals: qtc 427 QRS: LAFB ST changes: no st elevation Impression: abnormal ekg  ____________________________________________    RADIOLOGY  CXR No acute consolidations  ____________________________________________   PROCEDURES  Procedures  ____________________________________________   INITIAL IMPRESSION / ASSESSMENT AND PLAN / ED COURSE  Pertinent labs & imaging results that were available during my care of the patient were reviewed by me and considered in my medical decision making (see chart for details).   Patient presented to the emergency department today because of concerns for difficulty taking care of herself and desire to get into rehab.  Family does share this concern but also states that the patient's oxygen level has been decreasing recently. In terms of the shortness of breath concern for COPD exacerbation. CXR without pneumonia, small effusion. No pneumothorax. Patient's work up with elevated troponin. Tachycardia upon arrival. [facility] if it is medication related, pt given duoneb by ems and than here as well. Have lower suspicion for fat emboli given lack of chest pain. Will discuss with hospitalist for admission.    ____________________________________________   FINAL CLINICAL IMPRESSION(S) / ED DIAGNOSES  Final diagnoses:  COPD exacerbation ([place])  Elevated troponin     Note: This dictation was prepared with Dragon dictation. Any transcriptional errors that result from this process are unintentional     Nance Pear, MD 07/31/18 1526

## 2018-07-31 NOTE — ED Notes (Signed)
Report given to receiving nurse

## 2018-08-01 ENCOUNTER — Inpatient Hospital Stay (HOSPITAL_COMMUNITY)
Admit: 2018-08-01 | Discharge: 2018-08-01 | Disposition: A | Discharge status: Home / Self Care | Payer: Medicare Other | Attending: Specialist | Admitting: Specialist

## 2018-08-01 DIAGNOSIS — L8990 Pressure ulcer of unspecified site, unspecified stage: Secondary | ICD-10-CM

## 2018-08-01 DIAGNOSIS — I361 Nonrheumatic tricuspid (valve) insufficiency: Secondary | ICD-10-CM

## 2018-08-01 DIAGNOSIS — I370 Nonrheumatic pulmonary valve stenosis: Secondary | ICD-10-CM

## 2018-08-01 LAB — CBC
HCT: 30 % — ABNORMAL LOW (ref 36.0–46.0)
Hemoglobin: 9.8 g/dL — ABNORMAL LOW (ref 12.0–15.0)
MCH: 36.2 pg — ABNORMAL HIGH (ref 26.0–34.0)
MCHC: 32.7 g/dL (ref 30.0–36.0)
MCV: 110.7 fL — ABNORMAL HIGH (ref 80.0–100.0)
Platelets: 217 10*3/uL (ref 150–400)
RBC: 2.71 MIL/uL — ABNORMAL LOW (ref 3.87–5.11)
RDW: 14.3 % (ref 11.5–15.5)
WBC: 7.5 10*3/uL (ref 4.0–10.5)
nRBC: 0 % (ref 0.0–0.2)

## 2018-08-01 LAB — ECHOCARDIOGRAM COMPLETE
Height: 63 in
WEIGHTICAEL: 1481.49 [oz_av]

## 2018-08-01 LAB — BASIC METABOLIC PANEL
Anion gap: 8 (ref 5–15)
BUN: 17 mg/dL (ref 8–23)
CO2: 32 mmol/L (ref 22–32)
CREATININE: 0.48 mg/dL (ref 0.44–1.00)
Calcium: 8.6 mg/dL — ABNORMAL LOW (ref 8.9–10.3)
Chloride: 94 mmol/L — ABNORMAL LOW (ref 98–111)
GFR calc Af Amer: >60 mL/min (ref >60)
GFR calc non Af Amer: >60 mL/min (ref >60)
Glucose, Bld: 229 mg/dL — ABNORMAL HIGH (ref 70–99)
Potassium: 4.4 mmol/L (ref 3.5–5.1)
Sodium: 134 mmol/L — ABNORMAL LOW (ref 135–145)

## 2018-08-01 LAB — GLUCOSE, CAPILLARY
Glucose-Capillary: 192 mg/dL — ABNORMAL HIGH (ref 70–99)
Glucose-Capillary: 154 mg/dL — ABNORMAL HIGH (ref 70–99)

## 2018-08-01 LAB — TROPONIN I: Troponin I: <0.03 ng/mL (ref <0.03)

## 2018-08-01 MED ORDER — SODIUM CHLORIDE 0.9 % IV BOLUS
1000.0000 mL | Freq: Once | INTRAVENOUS | Status: AC
  Administered 2018-08-01: 1000 mL via INTRAVENOUS

## 2018-08-01 MED ORDER — VITAMIN C 500 MG PO TABS
250.0000 mg | ORAL_TABLET | Freq: Two times a day (BID) | ORAL | Status: DC
  Filled 2018-08-01 (×4): qty 1

## 2018-08-01 MED ORDER — ALBUTEROL SULFATE (2.5 MG/3ML) 0.083% IN NEBU: 2.5000 mg | INHALATION_SOLUTION | RESPIRATORY_TRACT | Status: DC | PRN

## 2018-08-01 MED ORDER — ENSURE ENLIVE PO LIQD: 237.0000 mL | Freq: Two times a day (BID) | ORAL | Status: DC

## 2018-08-01 MED ORDER — INSULIN ASPART 100 UNIT/ML ~~LOC~~ SOLN: 0.0000 [IU] | Freq: Every day | SUBCUTANEOUS | Status: DC

## 2018-08-01 MED ORDER — INSULIN ASPART 100 UNIT/ML ~~LOC~~ SOLN
0.0000 [IU] | Freq: Three times a day (TID) | SUBCUTANEOUS | Status: DC
  Administered 2018-08-01: 2 [IU] via SUBCUTANEOUS
  Administered 2018-08-01: 13:00:00 1 [IU] via SUBCUTANEOUS
  Administered 2018-08-02: 2 [IU] via SUBCUTANEOUS
  Administered 2018-08-02 (×2): 1 [IU] via SUBCUTANEOUS
  Administered 2018-08-03: 13:00:00 2 [IU] via SUBCUTANEOUS
  Administered 2018-08-03: 09:00:00 1 [IU] via SUBCUTANEOUS
  Filled 2018-08-01 (×7): qty 1

## 2018-08-01 MED ORDER — IPRATROPIUM-ALBUTEROL 0.5-2.5 (3) MG/3ML IN SOLN
3.0000 mL | Freq: Three times a day (TID) | RESPIRATORY_TRACT | Status: DC
  Administered 2018-08-01 – 2018-08-03 (×7): 3 mL via RESPIRATORY_TRACT
  Filled 2018-08-01 (×7): qty 3

## 2018-08-01 MED ORDER — ADULT MULTIVITAMIN W/MINERALS CH
1.0000 | ORAL_TABLET | Freq: Every day | ORAL | Status: DC
  Filled 2018-08-01: qty 1

## 2018-08-01 NOTE — Progress Notes (Signed)
*  PRELIMINARY RESULTS* Echocardiogram 2D Echocardiogram has been performed.  Madeline Schmitt 08/01/2018, 3:48 PM

## 2018-08-01 NOTE — NC FL2 (Signed)
[place] LEVEL OF CARE SCREENING TOOL     IDENTIFICATION  Patient Name: Madeline Schmitt Birthdate: 1946-08-06 Sex: female Admission Date (Current Location): 07/31/2018  [place] and [place] Number:  Engineering geologist and Address:  [facility], [address], [place], [place] [address]      Provider Number: [id number]  Attending Physician Name and Address:  Gladstone Lighter, MD  Relative Name and Phone Number:       Current Level of Care: Hospital Recommended Level of Care: [place] Prior Approval Number:    Date Approved/Denied:   PASRR Number: [id number]  Discharge Plan: SNF    Current Diagnoses: Patient Active Problem List   Diagnosis Date Noted  . Pressure injury of skin 08/01/2018  . COPD exacerbation ([place]) 07/31/2018  . Arrhythmia 01/07/2017  . COPD (chronic obstructive pulmonary disease) ([place]) 01/07/2017  . Diabetes mellitus type 2, uncomplicated ([place]) 93/71/6967  . Essential hypertension 01/07/2017  . Hyperlipidemia, unspecified 01/07/2017  . IBS (irritable bowel syndrome) 01/07/2017  . Macrocytosis without anemia 08/25/2016  . Thrombocytopenia ([place]) 08/25/2016  . Tobacco abuse 08/25/2016  . UTI (urinary tract infection) 08/25/2016  . Malignant neoplasm of upper lobe of left lung ([place]) 07/07/2016  . Mass of upper lobe of left lung 06/22/2016  . Grief reaction 12/30/2015  . Secondary polycythemia 12/23/2014  . Osteoporosis 04/09/2014  . Rheumatoid arthritis involving multiple sites with positive rheumatoid factor ([place]) 04/09/2014    Orientation RESPIRATION BLADDER Height & Weight     Self, Time, Place, Situation  O2(2 liters ) Incontinent Weight: 92 lb 9.5 oz (42 kg) Height:  5\' 3"  (160 cm)  BEHAVIORAL SYMPTOMS/MOOD NEUROLOGICAL BOWEL NUTRITION STATUS  (none) (none) Continent Diet(Heart Healthy )  AMBULATORY STATUS COMMUNICATION OF NEEDS Skin   Extensive Assist Verbally Other  (Comment)(Stage 1 on Coccyx, Stage 1 on back )                       Personal Care Assistance Level of Assistance  Bathing, Feeding, Dressing Bathing Assistance: Limited assistance Feeding assistance: Independent Dressing Assistance: Limited assistance     Functional Limitations Info  Sight, Hearing, Speech Sight Info: Adequate Hearing Info: Adequate Speech Info: Adequate    SPECIAL CARE FACTORS FREQUENCY  PT (By licensed PT), OT (By licensed OT)     PT Frequency: 5 OT Frequency: 5            Contractures Contractures Info: Not present    Additional Factors Info  Code Status, Allergies Code Status Info: DNR Allergies Info: Augmentin Amoxicillin-pot Clavulanate, Penicillins, Ace Inhibitors, Actonel Risedronate Sodium, Celexa Citalopram Hydrobromide, Ciprofloxacin, Dicyclomine Hcl, Hydrochlorothiazide W-triamterene, Maxzide Triamterene-hctz, Nicoderm Nicotine, Nsaids, Oxycodone, Zyban Bupropion           Current Medications (08/01/2018):  This is the current hospital active medication list Current Facility-Administered Medications  Medication Dose Route Frequency Provider Last Rate Last Dose  . acetaminophen (TYLENOL) tablet 650 mg  650 mg Oral Q6H PRN Henreitta Leber, MD       Or  . acetaminophen (TYLENOL) suppository 650 mg  650 mg Rectal Q6H PRN Sainani, Belia Heman, MD      . albuterol (PROVENTIL) (2.5 MG/3ML) 0.083% nebulizer solution 2.5 mg  2.5 mg Nebulization Q2H PRN Gladstone Lighter, MD      . amLODipine (NORVASC) tablet 2.5 mg  2.5 mg Oral Daily Henreitta Leber, MD      . aspirin EC tablet 81 mg  81 mg Oral Daily Henreitta Leber, MD   81 mg at 08/01/18 3154  . atenolol (TENORMIN) tablet 50 mg  50 mg Oral Daily Sainani, Vivek J, MD      . budesonide (PULMICORT) nebulizer solution 0.5 mg  0.5 mg Nebulization BID Henreitta Leber, MD   0.5 mg at 08/01/18 0751  . cholecalciferol (VITAMIN D) tablet 1,000 Units  1,000 Units Oral Daily Henreitta Leber, MD    1,000 Units at 08/01/18 0926  . clonazePAM (KLONOPIN) tablet 0.5 mg  0.5 mg Oral BID PRN Henreitta Leber, MD   0.5 mg at 08/01/18 0940  . doxycycline (VIBRA-TABS) tablet 100 mg  100 mg Oral BID Henreitta Leber, MD   100 mg at 08/01/18 0086  . enoxaparin (LOVENOX) injection 30 mg  30 mg Subcutaneous Q24H Colin Broach A, RPH   30 mg at 07/31/18 2057  . insulin aspart (novoLOG) injection 0-5 Units  0-5 Units Subcutaneous QHS Dallie Piles, RPH      . insulin aspart (novoLOG) injection 0-9 Units  0-9 Units Subcutaneous TID WC Dallie Piles, RPH   1 Units at 08/01/18 1255  . ipratropium-albuterol (DUONEB) 0.5-2.5 (3) MG/3ML nebulizer solution 3 mL  3 mL Nebulization TID Gladstone Lighter, MD      . methylPREDNISolone sodium succinate (SOLU-MEDROL) 40 mg/mL injection 40 mg  40 mg Intravenous Q8H Henreitta Leber, MD   40 mg at 08/01/18 0533  . mirtazapine (REMERON) tablet 30 mg  30 mg Oral QHS Henreitta Leber, MD   30 mg at 07/31/18 2051  . ondansetron (ZOFRAN) tablet 4 mg  4 mg Oral Q6H PRN Henreitta Leber, MD       Or  . ondansetron (ZOFRAN) injection 4 mg  4 mg Intravenous Q6H PRN Henreitta Leber, MD      . pantoprazole (PROTONIX) EC tablet 20 mg  20 mg Oral QHS Henreitta Leber, MD   20 mg at 07/31/18 2056  . QUEtiapine (SEROQUEL) tablet 25 mg  25 mg Oral QHS Henreitta Leber, MD   25 mg at 07/31/18 2056     Discharge Medications: Please see discharge summary for a list of discharge medications.  Relevant Imaging Results:  Relevant Lab Results:   Additional Information SSN: [id number]  Annamaria Boots, [place]

## 2018-08-01 NOTE — Progress Notes (Signed)
Initial Nutrition Assessment  DOCUMENTATION CODES:   Severe malnutrition in context of chronic illness  INTERVENTION:   Ensure Enlive po BID, each supplement provides 350 kcal and 20 grams of protein  MVI daily  Vitamin C 255m po BID  Liberalize diet   NUTRITION DIAGNOSIS:   Severe Malnutrition related to chronic illness(COPD) as evidenced by severe fat depletion, severe muscle depletion, 28 percent weight loss in 1 year and 16% weight loss over the past 6-7 months.  GOAL:   Patient will meet greater than or equal to 90% of their needs  MONITOR:   PO intake, Supplement acceptance, Weight trends, Labs, Skin, I & O's  REASON FOR ASSESSMENT:   Malnutrition Screening Tool    ASSESSMENT:   72year old female with past medical significant for lung cancer, COPD on 2 L home oxygen, ongoing smoking, hypertension, IBS, arthritis, osteoporosis presents to hospital secondary to shortness of breath.   RD met with pt in room today. Pt reports fair appetite and oral intake pta. Pt reports eating 2 small meals per day at home. Pt also reports drinking one chocolate Ensure per day at home. Pt reports eating 1/2 of her scrambled eggs and a blueberry muffin for breakfast this morning. Pt documented to have eaten 100% of her lunch. Per chart, pt with 35lb(28%) weight loss over the past year and and 18lb(16%) wt loss over the past 6-7 months; this is severe weight loss. RD will add supplements and MVI to help pt meet her estimated needs. Will also liberalize pt's diet as a heart healthy diet restricts protein. Recommend vitamin C supplementation to support wound healing. Of note, pt with macrocytic anemia; would recommend checking folate and B12 labs to rule out deficiency.   Medications reviewed and include: aspirin, vitamin D, doxycycline, lovenox, insulin, solu-medrol, mirtazapine, protonix  Labs reviewed: Na 134(L), Cl 94(L) Hgb 9.8(L), Hct 30.0(L), MCV 110.7(H), MCH 36.2(H)  NUTRITION -  FOCUSED PHYSICAL EXAM:    Most Recent Value  Orbital Region  Severe depletion  Upper Arm Region  Severe depletion  Thoracic and Lumbar Region  Severe depletion  Buccal Region  Severe depletion  Temple Region  Severe depletion  Clavicle Bone Region  Severe depletion  Clavicle and Acromion Bone Region  Severe depletion  Scapular Bone Region  Severe depletion  Dorsal Hand  Severe depletion  Patellar Region  Severe depletion  Anterior Thigh Region  Severe depletion  Posterior Calf Region  Severe depletion  Edema (RD Assessment)  None  Hair  Reviewed  Eyes  Reviewed  Mouth  Reviewed  Skin  Reviewed  Nails  Reviewed     Diet Order:   Diet Order            Diet regular Room service appropriate? Yes; Fluid consistency: Thin  Diet effective now             EDUCATION NEEDS:   Education needs have been addressed  Skin:  Skin Assessment: Reviewed RN Assessment(Stage I back and coccyx, ecchymosis )  Last BM:  12/15  Height:   Ht Readings from Last 1 Encounters:  07/31/18 _0  (1.6 m)    Weight:   Wt Readings from Last 1 Encounters:  07/31/18 42 kg    Ideal Body Weight:  52.3 kg  BMI:  Body mass index is 16.4 kg/m.  Estimated Nutritional Needs:   Kcal:  1200-1400kcal/day   Protein:  63-71g/day   Fluid:  1.0-1.2L/day   CKoleen DistanceMS, RD, LDN Pager #- [phone]  Office#- [phone] After Hours Pager: [phone]

## 2018-08-01 NOTE — Progress Notes (Signed)
Am BP 88/54.  MD notified.  1L bolus NS ordered.

## 2018-08-01 NOTE — Progress Notes (Signed)
[facility] [place] NAME: Madeline Schmitt    MR#:  [id number]  DATE OF BIRTH:  07/26/1946  SUBJECTIVE:  CHIEF COMPLAINT:   Chief Complaint  Patient presents with  . Shortness of Breath   -Was living by herself, came in with shortness of breath and noted to have COPD exacerbation -Still wheezing this morning. -PT recommended rehab  REVIEW OF SYSTEMS:  Review of Systems  Constitutional: Positive for malaise/fatigue. Negative for chills and fever.  HENT: Negative for congestion, ear discharge, hearing loss and nosebleeds.   Eyes: Negative for blurred vision and double vision.  Respiratory: Positive for shortness of breath and wheezing. Negative for cough.   Cardiovascular: Negative for chest pain and palpitations.  Gastrointestinal: Negative for abdominal pain, constipation, diarrhea, nausea and vomiting.  Genitourinary: Negative for dysuria.  Musculoskeletal: Positive for joint pain and myalgias.  Neurological: Negative for dizziness, focal weakness, seizures, weakness and headaches.  Psychiatric/Behavioral: Negative for depression.    DRUG ALLERGIES:   Allergies  Allergen Reactions  . Augmentin [Amoxicillin-Pot Clavulanate] Shortness Of Breath  . Penicillins Shortness Of Breath  . Ace Inhibitors Other (See Comments)    Unknown   . Actonel [Risedronate Sodium] Other (See Comments)    Chest tightness  . Celexa [Citalopram Hydrobromide] Other (See Comments)    Chest tightness  . Ciprofloxacin Itching    Facial itching  . Dicyclomine Hcl Other (See Comments)    Flatulence  . Hydrochlorothiazide W-Triamterene     Other reaction(s): Other (See Comments) cramps  . Maxzide [Triamterene-Hctz] Other (See Comments)    unknown  . Nicoderm [Nicotine] Other (See Comments)    Unknown   . Nsaids Other (See Comments)    dyspepsia Unknown   . Oxycodone Other (See Comments)    Alt Mental Status  . Zyban [Bupropion] Other (See  Comments)    Nervous    VITALS:  Blood pressure 106/61, pulse (!) 110, temperature 98.1 F (36.7 C), temperature source Oral, resp. rate 20, height 5\' 3"  (1.6 m), weight 42 kg, SpO2 90 %.  PHYSICAL EXAMINATION:  Physical Exam  GENERAL:  72 y.o.-year-old patient lying in the bed with no acute distress. Has hoarseness of voice secondary to recurrent laryngeal nerve involvement.  Follows with ENT. EYES: Pupils equal, round, reactive to light and accommodation. No scleral icterus. Extraocular muscles intact.  HEENT: Head atraumatic, normocephalic. Oropharynx and nasopharynx clear.  NECK:  Supple, no jugular venous distention. No thyroid enlargement, no tenderness.  LUNGS: Diffuse scattered expiratory wheezing all over the lung fields, no  rales,rhonchi or crepitation. No use of accessory muscles of respiration.  CARDIOVASCULAR: S1, S2 normal. No  rubs, or gallops.  3/6 systolic murmur is present ABDOMEN: Soft, nontender, nondistended. Bowel sounds present. No organomegaly or mass.  EXTREMITIES: No pedal edema, cyanosis, or clubbing.  NEUROLOGIC: Cranial nerves II through XII are intact. Muscle strength 5/5 in all extremities. Sensation intact. Gait not checked.  PSYCHIATRIC: The patient is alert and oriented x 3.  SKIN: No obvious rash, lesion, or ulcer.    LABORATORY PANEL:   CBC Recent Labs  Lab 08/01/18 0313  WBC 7.5  HGB 9.8*  HCT 30.0*  PLT 217   ------------------------------------------------------------------------------------------------------------------  Chemistries  Recent Labs  Lab 07/27/18 1156  08/01/18 0313  NA  --    < > 134*  K  --    < > 4.4  CL  --    < > 94*  CO2  --    < >  32  GLUCOSE  --    < > 229*  BUN  --    < > 17  CREATININE  --    < > 0.48  CALCIUM  --    < > 8.6*  AST 18  --   --   ALT 12  --   --   ALKPHOS 107  --   --   BILITOT 0.9  --   --    < > = values in this interval not displayed.    ------------------------------------------------------------------------------------------------------------------  Cardiac Enzymes Recent Labs  Lab 08/01/18 0313  TROPONINI <0.03   ------------------------------------------------------------------------------------------------------------------  RADIOLOGY:  Dg Chest 2 View  Result Date: 07/31/2018 CLINICAL DATA:  72 y/o F; 3 weeks productive cough and shortness of breath for 7 day days. EXAM: CHEST - 2 VIEW COMPARISON:  07/27/2018 chest radiograph. FINDINGS: Stable cardiac silhouette given projection and technique, partially obscured by left lung airspace opacities. Calcific aortic atherosclerosis. Elevation of left hemidiaphragm, left upper lobe paramediastinal mass, and left effusion are stable. Stable emphysema and right lung hyperinflation. No new focal consolidation. Acute fracture of the left humeral neck again noted. Stable L1 severe compression deformity. IMPRESSION: Stable elevated left hemidiaphragm, left upper lobe paramediastinal mass, and left effusion. No new focal consolidation. Electronically Signed   By: Kristine Garbe M.D.   On: 07/31/2018 14:19    EKG:   Orders placed or performed during the hospital encounter of 07/31/18  . EKG 12-Lead  . EKG 12-Lead    ASSESSMENT AND PLAN:   73 year old female with past medical significant for COPD on 2 L home oxygen, ongoing smoking, hypertension, IBS, arthritis, osteoporosis presents to hospital secondary to shortness of breath.  1.  Acute on chronic COPD exacerbation-currently requiring 2 L oxygen which is her home O2 -Agree with IV steroids, nebs and inhalers -Currently on oral doxycycline for bronchitis.  Chest x-ray with no other acute changes.  2.  Generalized weakness-stays at home by herself.  Also recent fall with left-sided humeral fracture and currently in a sling. -PT consulted, will need rehab at discharge.  3.  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 -Patient has refused biopsy in the past.  Status post radiation, outpatient follow-up recommended  4.  Hypertension-on Norvasc, atenolol  5.  Depression-on Seroquel and Remeron  6.  DVT prophylaxis-Lovenox    All the records are reviewed and case discussed with Care Management/Social Workerr. Management plans discussed with the patient, family and they are in agreement.  CODE STATUS: DNR  TOTAL TIME TAKING CARE OF THIS PATIENT: .37 minutes.   POSSIBLE D/C IN 2 DAYS, DEPENDING ON CLINICAL CONDITION.   Gladstone Lighter M.D on 08/01/2018 at 2:49 PM  Between 7am to 6pm - Pager - [phone]  After 6pm go to [email or web address] - password [email or web address] [facility]  Office  [phone]  CC: Primary care physician; Adin Hector, MD

## 2018-08-01 NOTE — Clinical Social Work Note (Signed)
Clinical Social Work Assessment  Patient Details  Name: Madeline Schmitt MRN: [id number] Date of Birth: 22-Dec-1945  Date of referral:  08/01/18               Reason for consult:  Facility Placement                Permission sought to share information with:  Case Manager, Customer service manager, Family Supports Permission granted to share information::  Yes, Verbal Permission Granted  Name::      SNF  Agency::   [facility]   Relationship::     Contact Information:     Housing/Transportation Living arrangements for the past 2 months:  [place] of Information:  Patient Patient Interpreter Needed:  None Criminal Activity/Legal Involvement Pertinent to Current Situation/Hospitalization:  No - Comment as needed Significant Relationships:  Adult Children Lives with:  Self Do you feel safe going back to the place where you live?  No Need for family participation in patient care:  No (Coment)  Care giving concerns: Patient lives alone in [place]    Social Worker assessment / plan:  CSW consulted for SNF placement. CSW met with patient to discuss discharge plan. CSW introduced self and explained role. Patient reports that she lives alone and she was independent prior to hospitalization. CSW explained PT recommendation of SNF. Patient is in agreement with SNF for rehab. Patient reports that she has not been to a SNF before and does not have a preference. CSW provided patient with Medicare.gov listing for her zipcode and explained quality ratings. CSW will begin bed search and give offers once received. CSW will follow for discharge planning.   Employment status:  Retired Forensic scientist:  [employer] PT Recommendations:  [place] / Referral to community resources:  [place]  Patient/Family's Response to care:  Patient thanked CSW for assistance   Patient/Family's Understanding of and Emotional Response to  Diagnosis, Current Treatment, and Prognosis:  Patient states understanding current diagnoses and treatment   Emotional Assessment Appearance:  Appears stated age Attitude/Demeanor/Rapport:  Lethargic Affect (typically observed):  Accepting Orientation:  Oriented to Self, Oriented to Place, Oriented to  Time, Oriented to Situation Alcohol / Substance use:  Not Applicable Psych involvement (Current and /or in the community):  No (Comment)  Discharge Needs  Concerns to be addressed:  Discharge Planning Concerns Readmission within the last 30 days:  No Current discharge risk:  Dependent with Mobility Barriers to Discharge:  Continued Medical Work up   [employer], [place] 08/01/2018, 2:23 PM

## 2018-08-01 NOTE — Evaluation (Signed)
Physical Therapy Evaluation Patient Details Name: Madeline Schmitt MRN: [id number] DOB: 1945-10-15 Today's Date: 08/01/2018   History of Present Illness  72 y.o. female with a history of COPD diabetes, RA hypertension lung cancer. 2 weeks ago she had a fall with non-surgical left humeral neck fx.  She was d/c'd home and has struggled significantly as well as respiratory failure.   Clinical Impression  Pt showed good effort with PT exam despite being in a lot of pain and having significant fatigue with even minimal activity.  She needed a lot of assist to get to sitting at EOB as well as in getting to standing.  Once in standing she struggled significantly with maintaining balance, using QC appropriate and functionally speaking she was unable to do any real ambulation needing heavy assist just to transfer/turn to get to the recliner. She uses O2 on at night at baseline, on 2 liters today she had shortness of breath with even minimal activity and had significant drop in O2 to the low 80s.  Pt very weak, very unsteady and completely unsafe to go home at this time, will need STR.      Follow Up Recommendations SNF    Equipment Recommendations  None recommended by PT    Recommendations for Other Services       Precautions / Restrictions Precautions Precautions: Fall;Shoulder Shoulder Interventions: Shoulder sling/immobilizer Restrictions Weight Bearing Restrictions: Yes LUE Weight Bearing: Non weight bearing      Mobility  Bed Mobility Overal bed mobility: Needs Assistance Bed Mobility: Supine to Sit     Supine to sit: Mod assist;Min assist     General bed mobility comments: Pt very guarded with transition to sitting EOB, able to help some pulling on PTs hand to get to upright  Transfers Overall transfer level: Needs assistance Equipment used: Quad cane Transfers: Sit to/from Stand Sit to Stand: Mod assist;Min assist         General transfer comment: Pt able to initiate  getting to standing but quickly needed assist to get to fully upright  Ambulation/Gait Ambulation/Gait assistance: Max assist Gait Distance (Feet): 3 Feet Assistive device: Quad cane       General Gait Details: Pt struggled with standing balance/weight shifting and essentially was unable to take more than 1 or 2 real steps before loosing balance and feeling too fatigued. She needed to sit down veyr quickly and was exhausted with this modest effort.   Stairs            Wheelchair Mobility    Modified Rankin (Stroke Patients Only)       Balance Overall balance assessment: Needs assistance   Sitting balance-Leahy Scale: Good       Standing balance-Leahy Scale: Poor Standing balance comment: Pt very unsteady in standing and unable to maintain balance w/o heavy UE use of cane as well as direct PT assist to stay upright                             Pertinent Vitals/Pain Pain Assessment: 0-10 Pain Score: 6  Pain Location: pain in L arm goes up signficantly with any movement    Home Living Family/patient expects to be discharged to:: Private residence Living Arrangements: Alone Available Help at Discharge: (siblings have been around 24/7) Type of Home: Apartment Home Access: Level entry     Home Layout: One level Home Equipment: Walker - 4 wheels;Bedside commode;Grab bars - tub/shower;Cane - single point;Cane -  quad      Prior Function Level of Independence: Independent with assistive device(s)         Comments: mod I with rollator/SPC as needed per patient. Family assists with IADLs, pt does endorse that she drives (pick up services for groceries)  Has been unable to do a lot since going home last week     Hand Dominance        Extremity/Trunk Assessment   Upper Extremity Assessment Upper Extremity Assessment: Generalized weakness(L in sling, not tested)    Lower Extremity Assessment Lower Extremity Assessment: Generalized weakness;Overall  WFL for tasks assessed       Communication   Communication: No difficulties  Cognition Arousal/Alertness: Awake/alert Behavior During Therapy: WFL for tasks assessed/performed Overall Cognitive Status: Within Functional Limits for tasks assessed                                        General Comments      Exercises     Assessment/Plan    PT Assessment Patient needs continued PT services  PT Problem List Decreased strength;Decreased range of motion;Decreased knowledge of use of DME;Decreased activity tolerance;Decreased balance;Decreased mobility;Pain       PT Treatment Interventions DME instruction;Balance training;Gait training;Neuromuscular re-education;Stair training;Functional mobility training;Patient/family education;Therapeutic activities;Therapeutic exercise    PT Goals (Current goals can be found in the Care Plan section)  Acute Rehab PT Goals Patient Stated Goal: get back to walking PT Goal Formulation: With patient Time For Goal Achievement: 08/15/18 Potential to Achieve Goals: Fair    Frequency Min 2X/week   Barriers to discharge        Co-evaluation               AM-PAC PT "6 Clicks" Mobility  Outcome Measure Help needed turning from your back to your side while in a flat bed without using bedrails?: A Lot Help needed moving from lying on your back to sitting on the side of a flat bed without using bedrails?: A Lot Help needed moving to and from a bed to a chair (including a wheelchair)?: A Lot Help needed standing up from a chair using your arms (e.g., wheelchair or bedside chair)?: A Lot Help needed to walk in hospital room?: Total Help needed climbing 3-5 steps with a railing? : Total 6 Click Score: 10    End of Session Equipment Utilized During Treatment: Gait belt Activity Tolerance: Patient limited by fatigue Patient left: with chair alarm set;with call bell/phone within reach Nurse Communication: Mobility status PT  Visit Diagnosis: Unsteadiness on feet (R26.81);History of falling (Z91.81);Difficulty in walking, not elsewhere classified (R26.2);Pain Pain - Right/Left: Left Pain - part of body: Shoulder    Time: 6759-1638 PT Time Calculation (min) (ACUTE ONLY): 22 min   Charges:   PT Evaluation $PT Eval Low Complexity: 1 Low          Kreg Shropshire, DPT 08/01/2018, 2:04 PM

## 2018-08-02 DIAGNOSIS — E43 Unspecified severe protein-calorie malnutrition: Secondary | ICD-10-CM

## 2018-08-02 LAB — BASIC METABOLIC PANEL
ANION GAP: 5 (ref 5–15)
BUN: 20 mg/dL (ref 8–23)
CO2: 34 mmol/L — AB (ref 22–32)
Calcium: 9 mg/dL (ref 8.9–10.3)
Chloride: 98 mmol/L (ref 98–111)
Creatinine, Ser: 0.4 mg/dL — ABNORMAL LOW (ref 0.44–1.00)
GFR calc Af Amer: >60 mL/min (ref >60)
GFR calc non Af Amer: >60 mL/min (ref >60)
Glucose, Bld: 179 mg/dL — ABNORMAL HIGH (ref 70–99)
Potassium: 4.3 mmol/L (ref 3.5–5.1)
Sodium: 137 mmol/L (ref 135–145)

## 2018-08-02 LAB — GLUCOSE, CAPILLARY
GLUCOSE-CAPILLARY: 131 mg/dL — AB (ref 70–99)
GLUCOSE-CAPILLARY: 199 mg/dL — AB (ref 70–99)
Glucose-Capillary: 140 mg/dL — ABNORMAL HIGH (ref 70–99)
Glucose-Capillary: 148 mg/dL — ABNORMAL HIGH (ref 70–99)
Glucose-Capillary: 151 mg/dL — ABNORMAL HIGH (ref 70–99)

## 2018-08-02 LAB — CBC
HCT: 29.1 % — ABNORMAL LOW (ref 36.0–46.0)
Hemoglobin: 9.4 g/dL — ABNORMAL LOW (ref 12.0–15.0)
MCH: 36.3 pg — ABNORMAL HIGH (ref 26.0–34.0)
MCHC: 32.3 g/dL (ref 30.0–36.0)
MCV: 112.4 fL — ABNORMAL HIGH (ref 80.0–100.0)
Platelets: 260 10*3/uL (ref 150–400)
RBC: 2.59 MIL/uL — ABNORMAL LOW (ref 3.87–5.11)
RDW: 14.2 % (ref 11.5–15.5)
WBC: 10.2 10*3/uL (ref 4.0–10.5)
nRBC: 0 % (ref 0.0–0.2)

## 2018-08-02 MED ORDER — GUAIFENESIN-DM 100-10 MG/5ML PO SYRP
5.0000 mL | ORAL_SOLUTION | Freq: Four times a day (QID) | ORAL | Status: DC | PRN
  Filled 2018-08-02: qty 5

## 2018-08-02 MED ORDER — INFLUENZA VAC SPLIT HIGH-DOSE 0.5 ML IM SUSY
0.5000 mL | PREFILLED_SYRINGE | INTRAMUSCULAR | Status: DC
  Filled 2018-08-02: qty 0.5

## 2018-08-02 MED ORDER — GUAIFENESIN ER 600 MG PO TB12: 600.0000 mg | ORAL_TABLET | Freq: Two times a day (BID) | ORAL | Status: DC

## 2018-08-02 NOTE — Clinical Social Work Note (Signed)
CSW presented bed offers to patient and daughter [name] at bedside. [name] chose [facility] for patient and patient is in agreement. CSW notified [facility] at [facility] of bed acceptance. CSW will continue to follow for discharge planning.   [place], [place]

## 2018-08-02 NOTE — Progress Notes (Signed)
[facility] [place] NAME: Madeline Schmitt    MR#:  [id number]  DATE OF BIRTH:  [birth year]  SUBJECTIVE:  CHIEF COMPLAINT:   Chief Complaint  Patient presents with  . Shortness of Breath   - still has significant cough and weakness - on o2 now at 2l- which is chronic  REVIEW OF SYSTEMS:  Review of Systems  Constitutional: Positive for malaise/fatigue. Negative for chills and fever.  HENT: Negative for congestion, ear discharge, hearing loss and nosebleeds.   Eyes: Negative for blurred vision and double vision.  Respiratory: Positive for shortness of breath and wheezing. Negative for cough.   Cardiovascular: Negative for chest pain and palpitations.  Gastrointestinal: Negative for abdominal pain, constipation, diarrhea, nausea and vomiting.  Genitourinary: Negative for dysuria.  Musculoskeletal: Positive for joint pain and myalgias.  Neurological: Negative for dizziness, focal weakness, seizures, weakness and headaches.  Psychiatric/Behavioral: Negative for depression.    DRUG ALLERGIES:   Allergies  Allergen Reactions  . Augmentin [Amoxicillin-Pot Clavulanate] Shortness Of Breath  . Penicillins Shortness Of Breath  . Ace Inhibitors Other (See Comments)    Unknown   . Actonel [Risedronate Sodium] Other (See Comments)    Chest tightness  . Celexa [Citalopram Hydrobromide] Other (See Comments)    Chest tightness  . Ciprofloxacin Itching    Facial itching  . Dicyclomine Hcl Other (See Comments)    Flatulence  . Hydrochlorothiazide W-Triamterene     Other reaction(s): Other (See Comments) cramps  . Maxzide [Triamterene-Hctz] Other (See Comments)    unknown  . Nicoderm [Nicotine] Other (See Comments)    Unknown   . Nsaids Other (See Comments)    dyspepsia Unknown   . Oxycodone Other (See Comments)    Alt Mental Status  . Zyban [Bupropion] Other (See Comments)    Nervous    VITALS:  Blood pressure (!) 107/55, pulse  85, temperature (!) 97.5 F (36.4 C), temperature source Oral, resp. rate 18, height 5\' 3"  (1.6 m), weight 42 kg, SpO2 97 %.  PHYSICAL EXAMINATION:  Physical Exam  GENERAL:  72 y.o.-year-old patient lying in the bed with no acute distress. Has hoarseness of voice secondary to recurrent laryngeal nerve involvement.  Follows with ENT. EYES: Pupils equal, round, reactive to light and accommodation. No scleral icterus. Extraocular muscles intact.  HEENT: Head atraumatic, normocephalic. Oropharynx and nasopharynx clear.  NECK:  Supple, no jugular venous distention. No thyroid enlargement, no tenderness.  LUNGS: improved scattered expiratory wheezing all over the lung fields, no  rales,rhonchi or crepitation. No use of accessory muscles of respiration.  CARDIOVASCULAR: S1, S2 normal. No  rubs, or gallops.  3/6 systolic murmur is present ABDOMEN: Soft, nontender, nondistended. Bowel sounds present. No organomegaly or mass.  EXTREMITIES: No pedal edema, cyanosis, or clubbing.  NEUROLOGIC: Cranial nerves II through XII are intact. Muscle strength 5/5 in all extremities. Sensation intact. Gait not checked.  PSYCHIATRIC: The patient is alert and oriented x 3.  SKIN: No obvious rash, lesion, or ulcer.    LABORATORY PANEL:   CBC Recent Labs  Lab 08/02/18 0419  WBC 10.2  HGB 9.4*  HCT 29.1*  PLT 260   ------------------------------------------------------------------------------------------------------------------  Chemistries  Recent Labs  Lab 07/27/18 1156  08/02/18 0419  NA  --    < > 137  K  --    < > 4.3  CL  --    < > 98  CO2  --    < >  34*  GLUCOSE  --    < > 179*  BUN  --    < > 20  CREATININE  --    < > 0.40*  CALCIUM  --    < > 9.0  AST 18  --   --   ALT 12  --   --   ALKPHOS 107  --   --   BILITOT 0.9  --   --    < > = values in this interval not displayed.    ------------------------------------------------------------------------------------------------------------------  Cardiac Enzymes Recent Labs  Lab 08/01/18 0313  TROPONINI <0.03   ------------------------------------------------------------------------------------------------------------------  RADIOLOGY:  Dg Chest 2 View  Result Date: 07/31/2018 CLINICAL DATA:  72 y/o F; 3 weeks productive cough and shortness of breath for 7 day days. EXAM: CHEST - 2 VIEW COMPARISON:  07/27/2018 chest radiograph. FINDINGS: Stable cardiac silhouette given projection and technique, partially obscured by left lung airspace opacities. Calcific aortic atherosclerosis. Elevation of left hemidiaphragm, left upper lobe paramediastinal mass, and left effusion are stable. Stable emphysema and right lung hyperinflation. No new focal consolidation. Acute fracture of the left humeral neck again noted. Stable L1 severe compression deformity. IMPRESSION: Stable elevated left hemidiaphragm, left upper lobe paramediastinal mass, and left effusion. No new focal consolidation. Electronically Signed   By: Kristine Garbe M.D.   On: 07/31/2018 14:19    EKG:   Orders placed or performed during the hospital encounter of 07/31/18  . EKG 12-Lead  . EKG 12-Lead    ASSESSMENT AND PLAN:   72 year old female with past medical significant for COPD on 2 L home oxygen, ongoing smoking, hypertension, IBS, arthritis, osteoporosis presents to hospital secondary to shortness of breath.  1.  Acute on chronic COPD exacerbation-currently requiring 2 L oxygen which is her home O2 -continue with IV steroids, nebs and inhalers - on oral doxycycline for bronchitis.  Chest x-ray with no other acute changes.  2.  Generalized weakness-stays at home by herself.  Also recent fall with left-sided humeral fracture and currently in a sling. -PT consulted-rehab at discharge.  3.  Left upper lobe lung mass-patient has recurrent lung  mass. -Patient has refused biopsy in the past.  Status post radiation, outpatient follow-up recommended  4.  Hypertension- low BP, hold Norvasc, atenolol  5.  Depression-on Seroquel and Remeron  6.  DVT prophylaxis-Lovenox  Social worker consulted- needs rehab at discharge    All the records are reviewed and case discussed with Care Management/Social Workerr. Management plans discussed with the patient, family and they are in agreement.  CODE STATUS: DNR  TOTAL TIME TAKING CARE OF THIS PATIENT: .37 minutes.   POSSIBLE D/C IN 2 DAYS, DEPENDING ON CLINICAL CONDITION.   Gladstone Lighter M.D on 08/02/2018 at 1:14 PM  Between 7am to 6pm - Pager - [phone]  After 6pm go to [email or web address] - password [email or web address] [facility]  Office  [phone]  CC: Primary care physician; Adin Hector, MD

## 2018-08-03 LAB — GLUCOSE, CAPILLARY
Glucose-Capillary: 129 mg/dL — ABNORMAL HIGH (ref 70–99)
Glucose-Capillary: 158 mg/dL — ABNORMAL HIGH (ref 70–99)
Glucose-Capillary: 98 mg/dL (ref 70–99)

## 2018-08-03 MED ORDER — ASCORBIC ACID 250 MG PO TABS: 250.0000 mg | ORAL_TABLET | Freq: Every day | ORAL | 0 refills | Status: AC

## 2018-08-03 MED ORDER — ENSURE ENLIVE PO LIQD: 237.0000 mL | Freq: Two times a day (BID) | ORAL | 12 refills | Status: AC

## 2018-08-03 MED ORDER — CLONAZEPAM 0.5 MG PO TABS: 0.5000 mg | ORAL_TABLET | Freq: Two times a day (BID) | ORAL | 0 refills | Status: AC | PRN

## 2018-08-03 MED ORDER — ADULT MULTIVITAMIN W/MINERALS CH: 1.0000 | ORAL_TABLET | Freq: Every day | ORAL | 0 refills | Status: AC

## 2018-08-03 MED ORDER — DOXYCYCLINE HYCLATE 50 MG PO CAPS: 100.0000 mg | ORAL_CAPSULE | Freq: Two times a day (BID) | ORAL | 0 refills | Status: AC

## 2018-08-03 NOTE — Plan of Care (Signed)

## 2018-08-03 NOTE — Progress Notes (Signed)
Called [facility] for the second time. I asked where she was in the line for pick up. They could not tell me. I relayed the information to the patient and the daughter , [name].  Collier Bullock RN

## 2018-08-03 NOTE — Progress Notes (Signed)
Advanced care plan.  Purpose of the Encounter: CODE STATUS  Parties in Attendance: Patient  Patient's Decision Capacity: Good  Subjective/Patient's story: Presented for shortness of breath wheezing and low oxygen saturation   Objective/Medical story Has acute on chronic hypoxic respiratory failure with COPD exacerbation Needs IV Solu-Medrol nebulization treatments aggressively   Goals of care determination:  Advance care directives goals of care and treatment plan discussed Patient does not want any CPR, intubation ventilator if the need arises   CODE STATUS: DNR   Time spent discussing advanced care planning: 16 minutes

## 2018-08-03 NOTE — Progress Notes (Signed)
Gave report tto Hospital doctor at [facility]. Collier Bullock RN

## 2018-08-03 NOTE — Discharge Summary (Signed)
[facility] NAME: Madeline Schmitt    MR#:  [id number]  DATE OF BIRTH:  1945/08/31  DATE OF ADMISSION:  07/31/2018 ADMITTING PHYSICIAN: Henreitta Leber, MD  DATE OF DISCHARGE: 08/03/2018  PRIMARY CARE PHYSICIAN: Adin Hector, MD   ADMISSION DIAGNOSIS:  Elevated troponin [R79.89] COPD exacerbation (HCC) [J44.1] Acute on chronic respiratory failure with hypoxia Elevated troponin GERD Anxiety disorder DISCHARGE DIAGNOSIS:  Active Problems:   COPD exacerbation (HCC)   Pressure injury of skin   Protein-calorie malnutrition, severe Acute on chronic respiratory failure with hypoxia Elevated troponin secondary to demand ischemia GERD  SECONDARY DIAGNOSIS:   Past Medical History:  Diagnosis Date  . Aortic atherosclerosis ([place])   . Arrhythmia   . COPD (chronic obstructive pulmonary disease) ([place])   . Diabetes mellitus type 2, uncomplicated ([place])   . Essential hypertension   . History of pelvic fracture   . Hyperlipidemia, unspecified   . IBS (irritable bowel syndrome)   . Lung cancer ([place])   . Lung mass   . Osteoporosis   . Rheumatoid arthritis involving multiple sites with positive rheumatoid factor ([place])   . Secondary polycythemia      ADMITTING HISTORY Madeline Schmitt  is a 72 y.o. female with a known history of COPD with ongoing tobacco abuse, chronic respiratory failure, hypertension, hyperlipidemia, IBS, osteoporosis, rheumatoid arthritis who presents to the hospital due to significant weakness and shortness of breath.  Patient apparently had a mechanical fall about 10 days ago and had a left humeral fracture.  She was seen in the ER and then discharged home and was receiving home health services but over the past 10 days she has become progressively weak and has not been able to take care of herself.  She is not even able to get out of bed.  She is been receiving home health nursing services with noticed that her O2  sats have been lower than normal and therefore she was sent to the ER for further evaluation.  In the emergency room patient was noted to be somewhat hypoxic and given some steroids and duo nebs and also noted to have a mildly elevated troponin.  Hospitalist services were contacted for admission.  Patient admits to a cough which is nonproductive, but she denies any fevers chills, nausea, vomiting, abdominal pain or any other associated symptoms presently.  HOSPITAL COURSE:  Patient was admitted to medical floor.  Continue oxygen via nasal cannula and she was received IV Solu-Medrol and aggressive nebulization treatments.  Patient continued oral doxycycline antibiotic empirically for bronchitis.  Her elevated troponin secondary to demand ischemia.  Patient shortness of breath improved wheezing improved.  She was worked up with echocardiogram which showed EF of 65 to 70%.  Wall motion was normal.  No evidence of any systolic dysfunction.  Leukocytosis resolved.  She was evaluated by physical therapy who recommended SNF placement.  Patient unable to take care of herself at home.  [facility]  [facility]  CONSULTS OBTAINED:    DRUG ALLERGIES:   Allergies  Allergen Reactions  . Augmentin [Amoxicillin-Pot Clavulanate] Shortness Of Breath  . Penicillins Shortness Of Breath  . Ace Inhibitors Other (See Comments)    Unknown   . Actonel [Risedronate Sodium] Other (See Comments)    Chest tightness  . Celexa [Citalopram Hydrobromide] Other (See Comments)    Chest tightness  . Ciprofloxacin Itching  Facial itching  . Dicyclomine Hcl Other (See Comments)    Flatulence  . Hydrochlorothiazide W-Triamterene     Other reaction(s): Other (See Comments) cramps  . Maxzide [Triamterene-Hctz] Other (See Comments)    unknown  . Nicoderm [Nicotine] Other (See Comments)    Unknown   . Nsaids Other (See Comments)     dyspepsia Unknown   . Oxycodone Other (See Comments)    Alt Mental Status  . Zyban [Bupropion] Other (See Comments)    Nervous    DISCHARGE MEDICATIONS:   Allergies as of 08/03/2018      Reactions   Augmentin [amoxicillin-pot Clavulanate] Shortness Of Breath   Penicillins Shortness Of Breath   Ace Inhibitors Other (See Comments)   Unknown   Actonel [risedronate Sodium] Other (See Comments)   Chest tightness   Celexa [citalopram Hydrobromide] Other (See Comments)   Chest tightness   Ciprofloxacin Itching   Facial itching   Dicyclomine Hcl Other (See Comments)   Flatulence   Hydrochlorothiazide W-triamterene    Other reaction(s): Other (See Comments) cramps   Maxzide [triamterene-hctz] Other (See Comments)   unknown   Nicoderm [nicotine] Other (See Comments)   Unknown   Nsaids Other (See Comments)   dyspepsia Unknown   Oxycodone Other (See Comments)   Alt Mental Status   Zyban [bupropion] Other (See Comments)   Nervous      Medication List    STOP taking these medications   doxycycline 100 MG tablet Commonly known as:  VIBRA-TABS Replaced by:  doxycycline 50 MG capsule   magic mouthwash Soln   ondansetron 8 MG tablet Commonly known as:  ZOFRAN   sucralfate 1 g tablet Commonly known as:  CARAFATE   traMADol 50 MG tablet Commonly known as:  ULTRAM     TAKE these medications   ADVAIR DISKUS 500-50 MCG/DOSE Aepb Generic drug:  Fluticasone-Salmeterol Inhale 1 puff into the lungs 2 (two) times daily.   amLODipine 2.5 MG tablet Commonly known as:  NORVASC Take 2.5 mg by mouth daily.   ascorbic acid 250 MG tablet Commonly known as:  VITAMIN C Take 1 tablet (250 mg total) by mouth daily.   aspirin 81 MG tablet Take 81 mg by mouth daily.   atenolol 50 MG tablet Commonly known as:  TENORMIN Take 50 mg by mouth daily.   azelastine 0.1 % nasal spray Commonly known as:  ASTELIN Place into the nose.   Cholecalciferol 25 MCG (1000 UT) tablet Take  1,000 Units by mouth daily.   clonazePAM 0.5 MG tablet Commonly known as:  KLONOPIN Take 1 tablet (0.5 mg total) by mouth 2 (two) times daily as needed for anxiety.   doxycycline 50 MG capsule Commonly known as:  VIBRAMYCIN Take 2 capsules (100 mg total) by mouth 2 (two) times daily for 3 days. Replaces:  doxycycline 100 MG tablet   feeding supplement (ENSURE ENLIVE) Liqd Take 237 mLs by mouth 2 (two) times daily between meals.   mirtazapine 30 MG tablet Commonly known as:  REMERON Take 30 mg by mouth at bedtime.   multivitamin with minerals Tabs tablet Take 1 tablet by mouth daily.   omeprazole 20 MG capsule Commonly known as:  PRILOSEC Take 20 mg by mouth daily as needed.   pantoprazole 20 MG tablet Commonly known as:  PROTONIX Take 20 mg by mouth at bedtime.   PROAIR HFA 108 (90 Base) MCG/ACT inhaler Generic drug:  albuterol Inhale 2 puffs into the lungs every 6 (six) hours as needed  for wheezing or shortness of breath.   QUEtiapine 25 MG tablet Commonly known as:  SEROQUEL Take 25 mg by mouth at bedtime.       Today  Patient seen today Decreased shortness of breath Decreased wheezing Comfortable on oxygen via nasal cannula VITAL SIGNS:  Blood pressure 116/67, pulse 98, temperature (!) 97.5 F (36.4 C), temperature source Oral, resp. rate 19, height 5\' 3"  (1.6 m), weight 42 kg, SpO2 97 %.  I/O:  No intake or output data in the 24 hours ending 08/03/18 1146  PHYSICAL EXAMINATION:  Physical Exam  GENERAL:  72 y.o.-year-old patient lying in the bed with no acute distress.  LUNGS: Normal breath sounds bilaterally, no wheezing, rales,rhonchi or crepitation. No use of accessory muscles of respiration.  CARDIOVASCULAR: S1, S2 normal. No murmurs, rubs, or gallops.  ABDOMEN: Soft, non-tender, non-distended. Bowel sounds present. No organomegaly or mass.  NEUROLOGIC: Moves all 4 extremities. PSYCHIATRIC: The patient is alert and oriented x 3.  SKIN: No obvious  rash, lesion, or ulcer.   DATA REVIEW:   CBC Recent Labs  Lab 08/02/18 0419  WBC 10.2  HGB 9.4*  HCT 29.1*  PLT 260    Chemistries  Recent Labs  Lab 07/27/18 1156  08/02/18 0419  NA  --    < > 137  K  --    < > 4.3  CL  --    < > 98  CO2  --    < > 34*  GLUCOSE  --    < > 179*  BUN  --    < > 20  CREATININE  --    < > 0.40*  CALCIUM  --    < > 9.0  AST 18  --   --   ALT 12  --   --   ALKPHOS 107  --   --   BILITOT 0.9  --   --    < > = values in this interval not displayed.    Cardiac Enzymes Recent Labs  Lab 08/01/18 0313  TROPONINI <0.03    Microbiology Results  No results found for this or any previous visit.  RADIOLOGY:  No results found.  Follow up with PCP in 1 week.  Management plans discussed with the patient, family and they are in agreement.  CODE STATUS: DNR    Code Status Orders  (From admission, onward)         Start     Ordered   07/31/18 1819  Do not attempt resuscitation (DNR)  Continuous    Question Answer Comment  In the event of cardiac or respiratory ARREST Do not call a "code blue"   In the event of cardiac or respiratory ARREST Do not perform Intubation, CPR, defibrillation or ACLS   In the event of cardiac or respiratory ARREST Use medication by any route, position, wound care, and other measures to relive pain and suffering. May use oxygen, suction and manual treatment of airway obstruction as needed for comfort.      07/31/18 1819        Code Status History    This patient has a current code status but no historical code status.    Advance Directive Documentation     Most Recent Value  Type of Advance Directive  Living will, Healthcare Power of Attorney  Pre-existing out of facility DNR order (yellow form or pink MOST form)  -  "MOST" Form in Place?  -  TOTAL TIME TAKING CARE OF THIS PATIENT ON DAY OF DISCHARGE: more than 35 minutes.   Saundra Shelling M.D on 08/03/2018 at 11:46 AM  Between 7am to 6pm -  Pager - [phone]  After 6pm go to [email or web address] - password [email or web address] [facility]  [facility]  Office  [phone]  CC: Primary care physician; Adin Hector, MD  Note: This dictation was prepared with Dragon dictation along with smaller phrase technology. Any transcriptional errors that result from this process are unintentional.

## 2018-08-03 NOTE — Care Management Important Message (Signed)
Important Message  Patient Details  Name: THEDORA RINGS MRN: [id number] Date of Birth: 08-25-1945   Medicare Important Message Given:  Yes    Juliann Pulse A Bellamie Turney 08/03/2018, 11:52 AM

## 2018-08-03 NOTE — Clinical Social Work Note (Signed)
Patient is medically ready for discharge today to [facility]. CSW notified patient and daughter Eddie Candle [phone]. CSW also notified Otila Kluver at [facility] of discharge today. Patient will be transported by EMS. RN to call report and call for transport.   [place], [place]

## 2018-09-01 ENCOUNTER — Telehealth: Payer: Self-pay

## 2018-09-01 NOTE — Telephone Encounter (Signed)
Messages left x 3 for daughter and patient to schedule palliative consult visit

## 2018-09-04 ENCOUNTER — Telehealth: Payer: Self-pay

## 2018-09-04 NOTE — Telephone Encounter (Signed)
Daughter, [name] returned phone call and reports that patient is now at [facility] facility in room 15. Scheduled palliative consult for 09/19/2018 at 11 am per daughter request

## 2018-09-19 ENCOUNTER — Encounter: Payer: Self-pay | Admitting: Nurse Practitioner

## 2018-09-19 ENCOUNTER — Non-Acute Institutional Stay: Payer: Medicare Other | Admitting: Nurse Practitioner

## 2018-09-19 VITALS — HR 88 | Temp 98.0°F | Resp 20 | Wt 92.6 lb

## 2018-09-19 DIAGNOSIS — Z515 Encounter for palliative care: Secondary | ICD-10-CM | POA: Insufficient documentation

## 2018-09-19 DIAGNOSIS — R0602 Shortness of breath: Secondary | ICD-10-CM | POA: Insufficient documentation

## 2018-09-19 DIAGNOSIS — R630 Anorexia: Secondary | ICD-10-CM

## 2018-09-19 NOTE — Progress Notes (Signed)
[facility] Telephone: [phone] Fax: [phone]  PATIENT NAME: Madeline Schmitt DOB: 10-09-1945 MRN: [id number]  PRIMARY CARE PROVIDER:   Adin Hector, MD  REFERRING PROVIDER:  Dr Dorita Fray [facility] RESPONSIBLE PARTY:   Daughter Madeline Schmitt at [phone]   RECOMMENDATIONS and PLAN:  1. Palliative care encounter Z51.5; Palliative medicine team will continue to support patient, patient's family, and medical team. Visit consisted of counseling and education dealing with the complex and emotionally intense issues of symptom management and palliative care in the setting of serious and potentially life-threatening illness  Medical goals of care with focus on comfort; DNR; hospice screening  2. Anorexia R63.0 but appetite remaining declined. Continue to encourage supplements and comfort feedings. Discuss nutrition.  3. Dyspneic R06.00 secondary to lung cancer, continue comfort care, inhalation therapy; continuous O2, may consider roxanol; hospice screening  ASSESSMENT:     I visited and observed Madeline Schmitt. We talked about purpose for palliative care visit. We talked about how she was feeling today. We talked about the last time she was independent at home which was prior to this hospitalization. We talked about past medical history in the setting of chronic disease. We talked about her diagnosis of cancer and receiving 60 radiation treatments. She talked about the radiation treatments causing her to lose her voice. We talked about residing at [facility] rehab in the progress that she made there. We talked about symptoms of pain what she denies. We talked about the edema and her feet and she shared as long as she keeps them up when sitting it improves the edema. We talked about shortness of breath. Madeline Schmitt endorses she does become short of breath with exertion and speaking at times. Oxygen does help and now that she's having to wear it all the time. She  shared that she did have to wear it prior to hospitalization at home at night. We talked about her functional level with becoming fatigued a lot easier. We talked about recent hospitalization with multiple catastrophic events including pneumonia. We talked about her chronic cough. We talked about her functional level and require assistance for adl's. We talked about her living independently at home and now having the reside in an assisted living facility. Madeline Schmitt was very verbal about having to rely on others and losing her independence. She was very vocal about sharing that she did not like it at all but knows this is what it has to be because she's not able to take care of herself at home. We talked about loss of Independence, grieving. 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 We talked about her daughter and son who helped her. She talked about not wanting to burden them with her health and that she would not be able to live with them. She talked about them having their own lives and she would not take that from them by burning them with her inability to care for herself. We talked about resigning at [facility]. We talked about difficulty with transition and she shared that she does know it's going to take some time for her to get used to this type of environment. We talked about activities. She talked about being more socially isolated. We talked about coping strategies. We talked about medical goals of care and her wishes are to be comfortable. DNR does remain in place. We talked about intubation, ventilator and she verbalize she does not want to  be on a ventilator. Advance directives in place. She shared that she wants to become comfortable. Her daughter and son arrived at palliative care visit. We talked about role of palliative care and revisited topics discussed with Madeline Schmitt. Ask Madeline Ellett if it was okay to meet with her son and daughter separately  and she was in agreement.  I met with Madeline Schmitt's. Son, Madeline Schmitt and daughter, Madeline Schmitt separately. We talked about the last time she was independent at home, past medical history in the setting of chronic disease. We talked about what does Vit[place]'s life was like the last several years. Her daughter endorses all she did was smoke and drink all day, socially isolate herself. That behavior started about three years ago with alcohol. She substituted alcohol for her food. Her daughter express that she started having more memory problems. We talked about recent hospitalization and transition to [facility] for short-term rehab. Madeline Schmitt endorses they had been talking about possible placement as she has not been able to care for herself very well at home due to her poor choices. We talked about palliative care visit with Madeline Schmitt. We talked about things that she expressed that were concerned. We talked about her transition to assisted living facility at [facility] currently. We talked about her O2 dependence. We talked about symptoms including shortness of breath. We talked about progression of COPD, or two dependents with lung cancer. We talked about her cancer at length in that despite it not being biopsied it did require 60 radiation treatments. It does appear to be slow-growing. She has had three episodes of pneumonia recently. We talked about difficulty treating pneumonia with antibiotic therapy. We talked about worsening overall condition and currently chronic cough. We talked about high risk for recurrence of pneumonia. We talked about medical goals of care including aggressive interventions versus conservative and comfort care. DNR does remain in place. Madeline Schmitt and Madeline Schmitt both express wishes are for [name]. She does not want to be put on a ventilator. We talked about high risk of rehospitalization and wishes are to keep her comfortable where she is. We talked about prognosis. We talked about  option of hospice screening. We talked about her currently receiving therapy for her humerus fracture which she is 6 weeks out and it is healing. Talked about continuing to do recommended exercises though son and daughter felt like it this time hospice was more beneficial to improve her quality of life. We talked about her current clinical condition and she does appear to be progressing towards the end part of her life with hospitalization, progression of COPD with lung cancer, debility, protein calorie malnutrition with symptoms of shortness of breath with any type of exertion including speaking. We talked about [facility] in life expectancy of six months or less. We talked about role of palliative care and plan of care. We talked about hospice home with life expectancy two to three weeks which she does not seem to be appropriate for hospice home at this time does appear possibly 6 months or less. We talked about quality of life versus quantity of days. We talked about loss of Independence , grieving and coping strategies. Son and daughter verbalize their wishes are to go ahead with hospice screening. They wish to update Madeline Wolgamott on decision for hospice screening as they are [place] of attorney. Therapeutic listening and emotional support provided. Questions answered to satisfaction. Contact information provided. I updated staff.  12 / 18 /  2019 sodium 137, potassium 4.3, chloride 98, Co2 34, calcium 9.0, bun 20, creatinine 0.40, glucose 179, WBC 10.2, hemoglobin 9.4, hemocrit 29.1, platelets 260  12 / 17 / 2019 Echo 65 to 67% with systolic pressure is moderately increased  I spent 120 minutes providing this consultation,  from 10:30 to 12:30pm. More than 50% of the time in this consultation was spent coordinating communication.   HISTORY OF PRESENT ILLNESS:  Madeline Schmitt is a 73 y.o. 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 Hospitalize 12 / 16 / 2019 to 12 / 19 / 2019 for shortness of breath and weakness. She had a file 10 days prior with a left humeral fracture. During those ten days until this hospitalization she was not able to get out of bed, O2 SATs lower than normal and found to be hypoxic in emergency department with workup significant for acute on chronic respiratory failure with hypoxia, COPD exacerbation, elevated troponin with anxiety disorder. She required oxygen, steroids with nebulizer treatments with antibiotic therapy empirically for bronchitis. Elevated troponin secondary to demand ischemia and leukocytosis resolved. She was evaluated by physical therapy but unable to take care of herself at home and was accepted at short-term rehab with transition to assisted living facility where she currently resides at [facility]. She does require assistance for transfers and is able to sit in the wheelchair. She requires assistance for adl's as she becomes more fatigued and easily short of breath. She does remain on continuous oxygen. She is able to feed herself and appetite has been poor. She is able to verbalize her needs for staff with a very soft voice. I present she is sitting in the recliner with her feet up. She appears thin, chronically ill but comfortable. Her daughter and son are on their way to the facility for palliative care meeting. Palliative Care was asked to help address goals of care.   CODE STATUS: DNR  PPS: 40% HOSPICE ELIGIBILITY/DIAGNOSIS: appears <6 months with clinically presentation  PAST MEDICAL HISTORY:  Past  Medical History:  Diagnosis Date  . Aortic atherosclerosis ([place])   . Arrhythmia   . COPD (chronic obstructive pulmonary disease) ([place])   . Diabetes mellitus type 2, uncomplicated ([place])   . Essential hypertension   . History of pelvic fracture   . Hyperlipidemia, unspecified   . IBS (irritable bowel syndrome)   . Lung cancer ([place])   . Lung mass   . Osteoporosis   . Rheumatoid arthritis involving multiple sites with positive rheumatoid factor ([place])   . Secondary polycythemia     SOCIAL HX:  Social History   Tobacco Use  . Smoking status: Current Some Day Smoker    Packs/day: 0.50    Years: 55.00    Pack years: 27.50  . Smokeless tobacco: Never Used  Substance Use Topics  . Alcohol use: Yes    Alcohol/week: 14.0 standard drinks    Types: 14 Glasses of wine per week    Comment: 1-3 glasses of wine a day    ALLERGIES:  Allergies  Allergen Reactions  . Augmentin [Amoxicillin-Pot Clavulanate] Shortness Of Breath  . Penicillins Shortness Of Breath  . Ace Inhibitors Other (See Comments)  Unknown   . Actonel [Risedronate Sodium] Other (See Comments)    Chest tightness  . Celexa [Citalopram Hydrobromide] Other (See Comments)    Chest tightness  . Ciprofloxacin Itching    Facial itching  . Dicyclomine Hcl Other (See Comments)    Flatulence  . Hydrochlorothiazide W-Triamterene     Other reaction(s): Other (See Comments) cramps  . Maxzide [Triamterene-Hctz] Other (See Comments)    unknown  . Nicoderm [Nicotine] Other (See Comments)    Unknown   . Nsaids Other (See Comments)    dyspepsia Unknown   . Oxycodone Other (See Comments)    Alt Mental Status  . Zyban [Bupropion] Other (See Comments)    Nervous     PERTINENT MEDICATIONS:  Outpatient Encounter Medications as of 09/19/2018  Medication Sig  . ADVAIR DISKUS 500-50 MCG/DOSE AEPB Inhale 1 puff into the lungs 2 (two) times daily.   Marland Kitchen albuterol (PROAIR HFA) 108 (90 Base) MCG/ACT inhaler Inhale 2 puffs into the  lungs every 6 (six) hours as needed for wheezing or shortness of breath.  Marland Kitchen amLODipine (NORVASC) 2.5 MG tablet Take 2.5 mg by mouth daily.  Marland Kitchen aspirin 81 MG tablet Take 81 mg by mouth daily.  Marland Kitchen atenolol (TENORMIN) 50 MG tablet Take 50 mg by mouth daily.   Marland Kitchen azelastine (ASTELIN) 0.1 % nasal spray Place into the nose.  . Cholecalciferol 1000 units tablet Take 1,000 Units by mouth daily.  . clonazePAM (KLONOPIN) 0.5 MG tablet Take 1 tablet (0.5 mg total) by mouth 2 (two) times daily as needed for anxiety.  . feeding supplement, ENSURE ENLIVE, (ENSURE ENLIVE) LIQD Take 237 mLs by mouth 2 (two) times daily between meals.  . mirtazapine (REMERON) 30 MG tablet Take 30 mg by mouth at bedtime.  Marland Kitchen omeprazole (PRILOSEC) 20 MG capsule Take 20 mg by mouth daily as needed.   . pantoprazole (PROTONIX) 20 MG tablet Take 20 mg by mouth at bedtime.  Marland Kitchen QUEtiapine (SEROQUEL) 25 MG tablet Take 25 mg by mouth at bedtime.   No facility-administered encounter medications on file as of 09/19/2018.     PHYSICAL EXAM:   General: frail appearing, thin, chroncially ill female Cardiovascular: regular rate and rhythm Pulmonary: +decrease bases, wheezes Abdomen: soft, nontender, + bowel sounds GU: no suprapubic tenderness Extremities: no edema, no joint deformities Skin: no rashes Neurological: Weakness but otherwise nonfocal  Christin Z Gusler, NP   \

## 2018-10-15 DEATH — deceased

## 2019-12-04 IMAGING — PT NM PET TUM IMG RESTAG (PS) SKULL BASE T - THIGH
1 of 10 series · 1 of 25 positions shown · non-contrast
Comparison: PET-CT 05/13/2017

CLINICAL DATA: Subsequent treatment strategy for left upper lobe
lung cancer..

EXAM:
NUCLEAR MEDICINE PET SKULL BASE TO THIGH
TECHNIQUE: 5.67 mCi F-18 FDG was injected intravenously. Full-ring PET imaging
was performed from the skull base to thigh after the radiotracer. CT
data was obtained and used for attenuation correction and anatomic
localization.
Fasting blood glucose: 134 mg/dl

[Series 3: ct wb 5.0 b30f · axial · 5.0mm · 0.98mm/px · 1 of 283 slices shown]
[im 283/283  brain]
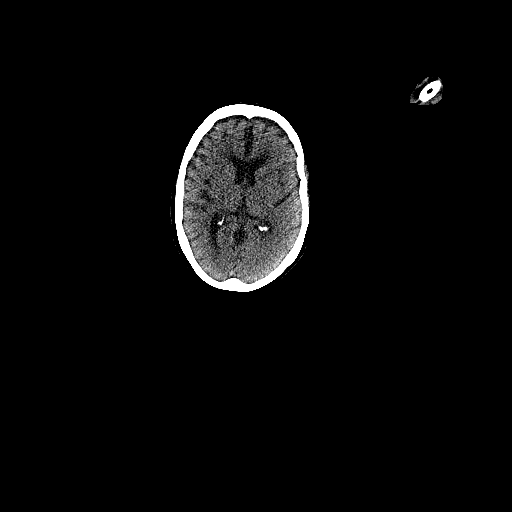

[1 of 25 positions shown; findings below may reference images not displayed]

FINDINGS: Mediastinal blood pool activity: SUV max

NECK: No hypermetabolic lymph nodes in the neck.

Incidental CT findings: none

CHEST: The left upper lobe subpleural paramediastinal mass measures
4.3 x 3.5 cm x 5.3 cm (volume = 42 cm^3) and has an SUV max of
10.33. On the previous exam this measured 2.3 x 2.3 x 4.5 cm (volume
= 12 cm^3) and had an SUV max equal to 8.24.

Within the left lower lobe there are multiple new patchy areas of
ground-glass attenuation and airspace consolidation. Likely post
inflammatory or infectious..

Incidental CT findings: Moderate changes of emphysema. No pleural
effusion. Normal heart size. Aortic atherosclerosis. Calcification
within the RCA and LAD coronary arteries noted.

ABDOMEN/PELVIS: No abnormal hypermetabolic activity within the
liver, pancreas, adrenal glands, or spleen. No hypermetabolic lymph
nodes in the abdomen or pelvis.

Incidental CT findings: Aortic atherosclerosis.  No aneurysm.

SKELETON: No focal hypermetabolic activity to suggest skeletal
metastasis.

Incidental CT findings: Chronic right inferior and superior pubic
rami fractures. There is a compression fracture at the L1 vertebra
which is new from previous exam. There is loss of 80% of the
vertebral body height. Mild chronic L4 compression fracture is
unchanged from previous.
IMPRESSION: 1. Interval increase in size and degree of FDG uptake associated
with subpleural paramediastinal mass within the left upper lobe.
2. New patchy areas of ground-glass and airspace consolidation
within the left lower lobe. Likely postinflammatory. Cannot rule out
aspiration or pneumonia.
3. New L1 compression fracture.
